# Patient Record
Sex: Female | Born: 1975 | Race: White | Hispanic: No | Marital: Married | Smoking: Former smoker
Health system: Southern US, Community
[De-identification: ages and names within clinical notes are randomized; demographics above are authoritative.]

## PROBLEM LIST (undated history)

## (undated) DIAGNOSIS — F32A Depression, unspecified: Secondary | ICD-10-CM

## (undated) DIAGNOSIS — G43909 Migraine, unspecified, not intractable, without status migrainosus: Secondary | ICD-10-CM

## (undated) DIAGNOSIS — F329 Major depressive disorder, single episode, unspecified: Secondary | ICD-10-CM

## (undated) HISTORY — PX: TONSILLECTOMY AND ADENOIDECTOMY: SUR1326

## (undated) HISTORY — PX: TONSILLECTOMY: SUR1361

## (undated) HISTORY — PX: APPENDECTOMY: SHX54

## (undated) HISTORY — DX: Migraine, unspecified, not intractable, without status migrainosus: G43.909

---

## 1999-02-28 ENCOUNTER — Inpatient Hospital Stay (HOSPITAL_COMMUNITY): Admission: AD | Admit: 1999-02-28 | Discharge: 1999-02-28 | Payer: Self-pay | Admitting: Obstetrics and Gynecology

## 1999-08-31 ENCOUNTER — Observation Stay (HOSPITAL_COMMUNITY): Admission: AD | Admit: 1999-08-31 | Discharge: 1999-09-01 | Payer: Self-pay | Admitting: Obstetrics and Gynecology

## 1999-09-04 ENCOUNTER — Inpatient Hospital Stay (HOSPITAL_COMMUNITY): Admission: AD | Admit: 1999-09-04 | Discharge: 1999-09-06 | Payer: Self-pay | Admitting: Obstetrics & Gynecology

## 2012-08-18 ENCOUNTER — Telehealth: Payer: Self-pay

## 2012-08-18 NOTE — Telephone Encounter (Deleted)
PT STATES SHE HAD BEEN SPEAKING WITH AMY REGARDING A PROBLEM SHE WAS HAVING AND WOULD LIKE TO SPEAK BACK WITH HER PLEASE CALL [phone]

## 2012-08-18 NOTE — Telephone Encounter (Signed)
THIS NOTE WAS PUT IN ERROR SORRY

## 2014-02-25 ENCOUNTER — Telehealth: Payer: Self-pay

## 2014-03-10 NOTE — Telephone Encounter (Signed)
No msg °

## 2015-10-13 DIAGNOSIS — E669 Obesity, unspecified: Secondary | ICD-10-CM | POA: Insufficient documentation

## 2015-10-13 DIAGNOSIS — F411 Generalized anxiety disorder: Secondary | ICD-10-CM | POA: Insufficient documentation

## 2017-07-18 ENCOUNTER — Emergency Department (HOSPITAL_BASED_OUTPATIENT_CLINIC_OR_DEPARTMENT_OTHER)
Admission: EM | Admit: 2017-07-18 | Discharge: 2017-07-18 | Disposition: A | Discharge status: Home / Self Care | Payer: Managed Care, Other (non HMO) | Attending: Emergency Medicine | Admitting: Emergency Medicine

## 2017-07-18 ENCOUNTER — Emergency Department (HOSPITAL_BASED_OUTPATIENT_CLINIC_OR_DEPARTMENT_OTHER): Payer: Managed Care, Other (non HMO)

## 2017-07-18 ENCOUNTER — Encounter (HOSPITAL_BASED_OUTPATIENT_CLINIC_OR_DEPARTMENT_OTHER): Payer: Self-pay | Admitting: *Deleted

## 2017-07-18 DIAGNOSIS — W010XXA Fall on same level from slipping, tripping and stumbling without subsequent striking against object, initial encounter: Secondary | ICD-10-CM | POA: Diagnosis not present

## 2017-07-18 DIAGNOSIS — Y92014 Private driveway to single-family (private) house as the place of occurrence of the external cause: Secondary | ICD-10-CM | POA: Insufficient documentation

## 2017-07-18 DIAGNOSIS — S93401A Sprain of unspecified ligament of right ankle, initial encounter: Secondary | ICD-10-CM | POA: Insufficient documentation

## 2017-07-18 DIAGNOSIS — Y998 Other external cause status: Secondary | ICD-10-CM | POA: Insufficient documentation

## 2017-07-18 DIAGNOSIS — Y9389 Activity, other specified: Secondary | ICD-10-CM | POA: Diagnosis not present

## 2017-07-18 DIAGNOSIS — Z87891 Personal history of nicotine dependence: Secondary | ICD-10-CM | POA: Diagnosis not present

## 2017-07-18 DIAGNOSIS — S99911A Unspecified injury of right ankle, initial encounter: Secondary | ICD-10-CM | POA: Diagnosis present

## 2017-07-18 DIAGNOSIS — W19XXXA Unspecified fall, initial encounter: Secondary | ICD-10-CM

## 2017-07-18 HISTORY — DX: Major depressive disorder, single episode, unspecified: F32.9

## 2017-07-18 HISTORY — DX: Depression, unspecified: F32.A

## 2017-07-18 MED ORDER — NAPROXEN 500 MG PO TABS: 500.0000 mg | ORAL_TABLET | Freq: Two times a day (BID) | ORAL | 0 refills | Status: DC

## 2017-07-18 NOTE — ED Triage Notes (Signed)
Slipped on wet driveway. Twisted her right ankle and foot.

## 2017-07-18 NOTE — Discharge Instructions (Signed)
Please read and follow all provided instructions.  Your diagnoses today include: Ankle sprain An ankle sprain is an injury to the ligaments that hold the ankle joint together causing them to get stretched or torn. It may take 4-6 weeks to heal fully. Your X-ray today showed no evidence of fracture (there is no evidence of broken bones).  For activity: Use crutches with non-weightbearing for the first few days. Exercises should be limited to pain free range of motion. You can start mobilization by tracing the alphabet with you foot in the air. Then, you may walk on your ankles as the pain allows (or as instructed). Start gradually with weight bearing on the affected ankle. Once you can walk pain free, then try jogging. When you can run forwards, then you can try moving side to side. If you cannot walk without crutches in one week, you need a recheck by your Family Doctor. It is important to keep all follow-up appointments as we discussed fractures may not appear until 1 week to 10 days after the acute injury. If you do not have a family doctor to follow-up with, you can see the list of phone numbers below. Please call today to make a followup appointment.  TREATMENT  Rest, ice, compression and elevation (RICE therapy) are the basic modes of treatment.   Rest is needed to allow your body to heal. Routine activities can be resumed when comfortable (as described above). Injury tendons and bones can take up to 6 weeks to heal. Tendons are cordlike structures that attach muscles and bones Ice: Apply ice to the sore area for 15 to 20 minutes, 3 to 4 times per day. Do this while you are awake for the first 2 days, or as directed. This can help reduce swelling and reduce pain.  Compression: this helps keep swelling down. It also gives support and helps with discomfort. If any lasting bandage has been applied, it should be removed and reapplied every 3-4 hours. It should not be applied tightly, but firmly enough to  keep swelling down. Watch fingers or toes for swelling, discoloration, coldness, numbness or excessive pain. If any of these problems occur, removed the bandage and reapply loosely. Contact your caregiver if these problems continue. If you were given an ankle stabilizer you may take it off at night and to take a shower or bath. Wiggle your toes in the splint several times per day if you are able.  Elevation helps reduce swelling and decrease your pain. With extremities such as the arms, hands, legs and feet, the injured area should be placed near or above the level of the heart if possible (place pillows underneath you leg/foot while you sleep to achieve this).  HOW TO MAKE AN ICE PACK  To make an ice pack, do one of the following:  Place crushed ice or a bag of frozen vegetables in a sealable plastic bag. Squeeze out the excess air. Place this bag inside another plastic bag. Slide the bag into a pillowcase or place a damp towel between your skin and the bag.  Mix 3 parts water with 1 part rubbing alcohol. Freeze the mixture in a sealable plastic bag. When you remove the mixture from the freezer, it will be slushy. Squeeze out the excess air. Place this bag inside another plastic bag. Slide the bag into a pillowcase or place a damp towel between your sk  Seek immediate medical attention if: You're toes are numb or tingling, appear gray or blue, or  you have severe pain. If this occurs please also elevate the leg and loosen the splint. Also if you have persistent pain and swelling, developed redness numbness or unexpected weakness, or your symptoms are getting worse rather than improving after several days. The symptoms may indicate that further evaluation or further x-rays are needed. Sometimes, x-rays may not show a small broken bone until one week or 10 days later. Make a follow-up appointment with your caregiver.  Additional Information:  Your vital signs today were: BP (!) 132/91    Pulse 80    Temp  98.7 F (37.1 C) (Oral)    Resp 18    Ht 5\' 3"  (1.6 m)    Wt 115.3 kg (254 lb 3.1 oz)    LMP 07/04/2017    SpO2 100%    BMI 45.03 kg/m  If your blood pressure (BP) was elevated above 135/85 this visit, please have this repeated by your doctor within one month. ---------------

## 2017-07-18 NOTE — ED Provider Notes (Signed)
[facility] EMERGENCY DEPARTMENT Provider Note   CSN: [id number] Arrival date & time: 07/18/17  1817     History   Chief Complaint Chief Complaint  Patient presents with  . Fall  . Foot Pain    HPI Sherri Hunt is a 41 y.o. female with no significant past medical history who presents the emergency department today for right ankle pain.  Earlier this evening the patient slipped on her wet driveway while she was trying to open the trunk of her car.  She states she twisted her right ankle in an inversion fashion, causing her to fall.  She denies any head trauma or loss of consciousness.  She has had no nausea or vomiting since the event.  She was unable to bear weight due to the pain of the right ankle immediately after the event.  Her pain is primarily in the lateral aspect of the ankle and on the top of the foot.  She has not taken anything for this.  She denies any numbness or tingling.  No open wound.  HPI  Past Medical History:  Diagnosis Date  . Depression     There are no active problems to display for this patient.   Past Surgical History:  Procedure Laterality Date  . APPENDECTOMY    . TONSILLECTOMY      OB History    No data available       Home Medications    Prior to Admission medications   Medication Sig Start Date End Date Taking? Authorizing Provider  BuPROPion HCl (WELLBUTRIN PO) Take by mouth.   Yes [provider]    Family History No family history on file.  Social History Social History  Substance Use Topics  . Smoking status: Former Games developer  . Smokeless tobacco: Never Used  . Alcohol use Yes     Allergies   Sulfa antibiotics   Review of Systems Review of Systems  Gastrointestinal: Negative for nausea and vomiting.  Musculoskeletal: Positive for arthralgias and joint swelling.  Skin: Negative for color change and wound.  Neurological: Negative for syncope and headaches.     Physical Exam Updated Vital  Signs BP (!) 132/91   Pulse 80   Temp 98.7 F (37.1 C) (Oral)   Resp 18   Ht 5\' 3"  (1.6 m)   Wt 115.3 kg (254 lb 3.1 oz)   LMP 07/04/2017   SpO2 100%   BMI 45.03 kg/m   Physical Exam  Constitutional: She appears well-developed and well-nourished.  HENT:  Head: Normocephalic and atraumatic.  Right Ear: External ear normal.  Left Ear: External ear normal.  Eyes: Conjunctivae are normal. Right eye exhibits no discharge. Left eye exhibits no discharge. No scleral icterus.  Cardiovascular:  Pulses:      Dorsalis pedis pulses are 2+ on the right side, and 2+ on the left side.       Posterior tibial pulses are 2+ on the right side, and 2+ on the left side.  Pulmonary/Chest: Effort normal. No respiratory distress.  Musculoskeletal:       Right knee: She exhibits normal range of motion and no swelling. No tenderness found.       Right ankle: She exhibits decreased range of motion (2/2 to pain) and swelling. She exhibits no ecchymosis, no deformity and normal pulse. Tenderness. Lateral malleolus tenderness found. No head of 5th metatarsal tenderness found. Achilles tendon normal. Achilles tendon exhibits no pain, no defect and normal Thompson's test results.  Right lower leg: Normal.       Right foot: There is tenderness ( Navicular ). There is normal range of motion.  Negative Lachman's test of the right knee.  Negative anterior drawer test of the right ankle. Neurovascularly intact distally. Compartments soft above and below affected joint.    Neurological: She is alert. No sensory deficit.  Speech clear. Follows commands. No facial droop. PERRLA. EOM grossly intact. CN III-XII grossly intact. Grossly moves all extremities 4 without ataxia. Able and appropriate strength for age to upper and lower extremities bilaterally including grip strength.   Skin: Skin is warm, dry and intact. No pallor.  Psychiatric: She has a normal mood and affect.  Nursing note and vitals  reviewed.    ED Treatments / Results  Labs (all labs ordered are listed, but only abnormal results are displayed) Labs Reviewed - No data to display  EKG  EKG Interpretation None       Radiology Dg Ankle Complete Right  Result Date: 07/18/2017 CLINICAL DATA:  Initial evaluation for acute trauma, fall. EXAM: RIGHT ANKLE - COMPLETE 3+ VIEW COMPARISON:  None. FINDINGS: No acute fracture or dislocation. Ankle mortise approximated. Talar dome intact. Mild diffuse soft tissue swelling about the ankle. Posterior plantar calcaneal enthesophytes noted. IMPRESSION: 1. No acute fracture or dislocation. 2. Mild diffuse soft tissue swelling about the ankle. Electronically Signed   By: Rise MuBenjamin  McClintock M.D.   On: 07/18/2017 19:42   Dg Foot Complete Right  Result Date: 07/18/2017 CLINICAL DATA:  Initial evaluation for acute trauma, fall. EXAM: RIGHT FOOT COMPLETE - 3+ VIEW COMPARISON:  None. FINDINGS: No acute fracture or dislocation. Joint spaces maintained without evidence for significant degenerative or erosive arthropathy. Posterior plantar calcaneal enthesophytes noted. No appreciable soft tissue injury. IMPRESSION: No acute osseous abnormality about the right foot. Electronically Signed   By: Rise MuBenjamin  McClintock M.D.   On: 07/18/2017 19:46    Procedures Procedures (including critical care time)  Medications Ordered in ED Medications - No data to display   Initial Impression / Assessment and Plan / ED Course  I have reviewed the triage vital signs and the nursing notes.  Pertinent labs & imaging results that were available during my care of the patient were reviewed by me and considered in my medical decision making (see chart for details).     Patient with fall. No head trauma or loss of consciousness.  She is having right ankle and foot pain. Patient X-Ray negative for obvious fracture or dislocation.  Patient denied pain medication in the ED.  Will give ASO brace and crutches.   Pt advised to follow up with orthopedics vs PCP if symptoms persist for possibility of missed fracture diagnosis. Conservative therapy recommended and discussed. Return precautions discussed. Patient will be dc home & is agreeable with above plan.  Final Clinical Impressions(s) / ED Diagnoses   Final diagnoses:  Sprain of right ankle, unspecified ligament, initial encounter  Fall, initial encounter    New Prescriptions New Prescriptions   NAPROXEN (NAPROSYN) 500 MG TABLET    Take 1 tablet (500 mg total) by mouth 2 (two) times daily.     Jacinto HalimMaczis, Michael M, PA-C 07/19/17 1501    Nira Connardama, Pedro Eduardo, MD 07/20/17 0030

## 2017-07-18 NOTE — ED Notes (Signed)
Pt discharged to home with family. NAD.  

## 2017-08-21 ENCOUNTER — Other Ambulatory Visit: Payer: Self-pay | Admitting: General Practice

## 2017-08-21 DIAGNOSIS — N6320 Unspecified lump in the left breast, unspecified quadrant: Secondary | ICD-10-CM

## 2019-08-19 DIAGNOSIS — G43111 Migraine with aura, intractable, with status migrainosus: Secondary | ICD-10-CM | POA: Insufficient documentation

## 2019-09-19 DIAGNOSIS — U071 COVID-19: Secondary | ICD-10-CM

## 2019-09-19 HISTORY — DX: COVID-19: U07.1

## 2019-11-24 ENCOUNTER — Other Ambulatory Visit: Payer: Self-pay

## 2019-11-24 ENCOUNTER — Encounter (HOSPITAL_BASED_OUTPATIENT_CLINIC_OR_DEPARTMENT_OTHER): Payer: Self-pay | Admitting: *Deleted

## 2019-11-24 ENCOUNTER — Emergency Department (HOSPITAL_COMMUNITY): Payer: Managed Care, Other (non HMO)

## 2019-11-24 ENCOUNTER — Emergency Department (HOSPITAL_BASED_OUTPATIENT_CLINIC_OR_DEPARTMENT_OTHER): Payer: Managed Care, Other (non HMO)

## 2019-11-24 ENCOUNTER — Emergency Department (HOSPITAL_BASED_OUTPATIENT_CLINIC_OR_DEPARTMENT_OTHER)
Admission: EM | Admit: 2019-11-24 | Discharge: 2019-11-25 | Disposition: A | Discharge status: Home / Self Care | Payer: Managed Care, Other (non HMO) | Attending: Emergency Medicine | Admitting: Emergency Medicine

## 2019-11-24 DIAGNOSIS — Z79899 Other long term (current) drug therapy: Secondary | ICD-10-CM | POA: Diagnosis not present

## 2019-11-24 DIAGNOSIS — R519 Headache, unspecified: Secondary | ICD-10-CM | POA: Insufficient documentation

## 2019-11-24 DIAGNOSIS — R471 Dysarthria and anarthria: Secondary | ICD-10-CM | POA: Diagnosis not present

## 2019-11-24 DIAGNOSIS — Z20822 Contact with and (suspected) exposure to covid-19: Secondary | ICD-10-CM | POA: Insufficient documentation

## 2019-11-24 DIAGNOSIS — Z87891 Personal history of nicotine dependence: Secondary | ICD-10-CM | POA: Insufficient documentation

## 2019-11-24 LAB — COMPREHENSIVE METABOLIC PANEL
ALT: 24 U/L (ref 0–44)
AST: 30 U/L (ref 15–41)
Albumin: 3.6 g/dL (ref 3.5–5.0)
Alkaline Phosphatase: 62 U/L (ref 38–126)
Anion gap: 8 (ref 5–15)
BUN: 11 mg/dL (ref 6–20)
CO2: 26 mmol/L (ref 22–32)
Calcium: 9.1 mg/dL (ref 8.9–10.3)
Chloride: 101 mmol/L (ref 98–111)
Creatinine, Ser: 0.89 mg/dL (ref 0.44–1.00)
GFR calc Af Amer: >60 mL/min (ref >60)
GFR calc non Af Amer: >60 mL/min (ref >60)
Glucose, Bld: 96 mg/dL (ref 70–99)
Potassium: 3.8 mmol/L (ref 3.5–5.1)
Sodium: 135 mmol/L (ref 135–145)
Total Bilirubin: 0.7 mg/dL (ref 0.3–1.2)
Total Protein: 7.3 g/dL (ref 6.5–8.1)

## 2019-11-24 LAB — URINALYSIS, MICROSCOPIC (REFLEX): WBC, UA: NONE SEEN WBC/hpf (ref 0–5)

## 2019-11-24 LAB — URINALYSIS, ROUTINE W REFLEX MICROSCOPIC
Bilirubin Urine: NEGATIVE
Glucose, UA: NEGATIVE mg/dL
Ketones, ur: NEGATIVE mg/dL
Leukocytes,Ua: NEGATIVE
Nitrite: NEGATIVE
Protein, ur: NEGATIVE mg/dL
Specific Gravity, Urine: 1.01 (ref 1.005–1.030)
pH: 7.5 (ref 5.0–8.0)

## 2019-11-24 LAB — CBC
HCT: 40.1 % (ref 36.0–46.0)
Hemoglobin: 13 g/dL (ref 12.0–15.0)
MCH: 26.7 pg (ref 26.0–34.0)
MCHC: 32.4 g/dL (ref 30.0–36.0)
MCV: 82.5 fL (ref 80.0–100.0)
Platelets: 422 10*3/uL — ABNORMAL HIGH (ref 150–400)
RBC: 4.86 MIL/uL (ref 3.87–5.11)
RDW: 14.4 % (ref 11.5–15.5)
WBC: 10 10*3/uL (ref 4.0–10.5)
nRBC: 0 % (ref 0.0–0.2)

## 2019-11-24 LAB — DIFFERENTIAL
Abs Immature Granulocytes: 0.04 10*3/uL (ref 0.00–0.07)
Basophils Absolute: 0.1 10*3/uL (ref 0.0–0.1)
Basophils Relative: 1 %
Eosinophils Absolute: 0.2 10*3/uL (ref 0.0–0.5)
Eosinophils Relative: 2 %
Immature Granulocytes: 0 %
Lymphocytes Relative: 23 %
Lymphs Abs: 2.3 10*3/uL (ref 0.7–4.0)
Monocytes Absolute: 0.6 10*3/uL (ref 0.1–1.0)
Monocytes Relative: 6 %
Neutro Abs: 6.8 10*3/uL (ref 1.7–7.7)
Neutrophils Relative %: 68 %

## 2019-11-24 LAB — SARS CORONAVIRUS 2 AG (30 MIN TAT): SARS Coronavirus 2 Ag: NEGATIVE

## 2019-11-24 LAB — PROTIME-INR
INR: 1.1 (ref 0.8–1.2)
Prothrombin Time: 13.6 seconds (ref 11.4–15.2)

## 2019-11-24 LAB — RAPID URINE DRUG SCREEN, HOSP PERFORMED
Amphetamines: NOT DETECTED
Barbiturates: NOT DETECTED
Benzodiazepines: NOT DETECTED
Cocaine: NOT DETECTED
Opiates: NOT DETECTED
Tetrahydrocannabinol: NOT DETECTED

## 2019-11-24 LAB — ETHANOL: Alcohol, Ethyl (B): <10 mg/dL (ref <10)

## 2019-11-24 LAB — APTT: aPTT: 31 seconds (ref 24–36)

## 2019-11-24 LAB — CBG MONITORING, ED: Glucose-Capillary: 91 mg/dL (ref 70–99)

## 2019-11-24 LAB — PREGNANCY, URINE: Preg Test, Ur: NEGATIVE

## 2019-11-24 MED ORDER — DEXAMETHASONE SODIUM PHOSPHATE 10 MG/ML IJ SOLN
10.0000 mg | Freq: Once | INTRAMUSCULAR | Status: AC
  Administered 2019-11-24: 10 mg via INTRAVENOUS
  Filled 2019-11-24: qty 1

## 2019-11-24 MED ORDER — DIPHENHYDRAMINE HCL 50 MG/ML IJ SOLN
25.0000 mg | Freq: Once | INTRAMUSCULAR | Status: AC
  Administered 2019-11-24: 25 mg via INTRAVENOUS
  Filled 2019-11-24: qty 1

## 2019-11-24 MED ORDER — METOCLOPRAMIDE HCL 5 MG/ML IJ SOLN
10.0000 mg | Freq: Once | INTRAMUSCULAR | Status: AC
  Administered 2019-11-24: 10 mg via INTRAVENOUS
  Filled 2019-11-24: qty 2

## 2019-11-24 MED ORDER — IOHEXOL 350 MG/ML SOLN
100.0000 mL | Freq: Once | INTRAVENOUS | Status: AC | PRN
  Administered 2019-11-24: 100 mL via INTRAVENOUS

## 2019-11-24 MED ORDER — KETOROLAC TROMETHAMINE 30 MG/ML IJ SOLN
30.0000 mg | Freq: Once | INTRAMUSCULAR | Status: AC
  Administered 2019-11-24: 30 mg via INTRAVENOUS
  Filled 2019-11-24: qty 1

## 2019-11-24 MED ORDER — SODIUM CHLORIDE 0.9 % IV BOLUS
1000.0000 mL | Freq: Once | INTRAVENOUS | Status: AC
  Administered 2019-11-24: 1000 mL via INTRAVENOUS

## 2019-11-24 MED ORDER — IOHEXOL 350 MG/ML SOLN
50.0000 mL | Freq: Once | INTRAVENOUS | Status: AC | PRN
  Administered 2019-11-24: 50 mL via INTRAVENOUS

## 2019-11-24 NOTE — ED Notes (Signed)
To CT with RN and monitor

## 2019-11-24 NOTE — ED Provider Notes (Signed)
[facility] EMERGENCY DEPARTMENT Provider Note   CSN: [id number] Arrival date & time: 11/24/19  3790  An emergency department physician performed an initial assessment on this suspected stroke patient at 93.  History Chief Complaint  Patient presents with  . Aphasia    Sherri Hunt is a 45 y.o. female.  Patient is a 44 year old female with history of depression and migraines.  She presents today for evaluation of headache and difficulty speaking.  For the past 2 days, she has had what she describes as a migraine.  This afternoon her husband went to the pharmacy.  When he returned, she was unable to speak.  Patient describes being unable to form words, but she is able to express herself using her cell phone and typing.  She denies any weakness or numbness.  She denies any visual disturbances.  The symptoms began at approximately 230 this afternoon.  The history is provided by the patient.       Past Medical History:  Diagnosis Date  . Depression     There are no problems to display for this patient.   Past Surgical History:  Procedure Laterality Date  . APPENDECTOMY    . TONSILLECTOMY       OB History   No obstetric history on file.     History reviewed. No pertinent family history.  Social History   Tobacco Use  . Smoking status: Former Games developer  . Smokeless tobacco: Never Used  Substance Use Topics  . Alcohol use: Yes  . Drug use: No    Home Medications Prior to Admission medications   Medication Sig Start Date End Date Taking? Authorizing Provider  naproxen (NAPROSYN) 500 MG tablet Take 1 tablet (500 mg total) by mouth 2 (two) times daily. 07/18/17   Maczis, Elmer Sow, PA-C    Allergies    Sulfa antibiotics  Review of Systems   Review of Systems  All other systems reviewed and are negative.   Physical Exam Updated Vital Signs BP (!) 146/98   Pulse 87   Resp 19   Ht 5\' 3"  (1.6 m)   Wt 133.6 kg   SpO2 99%   BMI 52.17 kg/m    Physical Exam Vitals and nursing note reviewed.  Constitutional:      General: She is not in acute distress.    Appearance: She is well-developed. She is not diaphoretic.  HENT:     Head: Normocephalic and atraumatic.  Eyes:     Extraocular Movements: Extraocular movements intact.     Pupils: Pupils are equal, round, and reactive to light.  Cardiovascular:     Rate and Rhythm: Normal rate and regular rhythm.     Heart sounds: No murmur. No friction rub. No gallop.   Pulmonary:     Effort: Pulmonary effort is normal. No respiratory distress.     Breath sounds: Normal breath sounds. No wheezing.  Abdominal:     General: Bowel sounds are normal. There is no distension.     Palpations: Abdomen is soft.     Tenderness: There is no abdominal tenderness.  Musculoskeletal:        General: Normal range of motion.     Cervical back: Normal range of motion and neck supple.  Skin:    General: Skin is warm and dry.  Neurological:     General: No focal deficit present.     Mental Status: She is alert and oriented to person, place, and time.     Cranial  Nerves: No cranial nerve deficit.     Comments: Patient with dysphasia.  She attempts to speak, but her words are slurred and incomprehensible.  She is able to express herself using her cell phone and types without difficulty.  Strength is 5 out of 5 in all extremities and sensation is intact throughout.  Finger-to-nose is normal.      ED Results / Procedures / Treatments   Labs (all labs ordered are listed, but only abnormal results are displayed) Labs Reviewed  CBC - Abnormal; Notable for the following components:      Result Value   Platelets 422 (*)    All other components within normal limits  DIFFERENTIAL  ETHANOL  PROTIME-INR  APTT  COMPREHENSIVE METABOLIC PANEL  RAPID URINE DRUG SCREEN, HOSP PERFORMED  URINALYSIS, ROUTINE W REFLEX MICROSCOPIC  PREGNANCY, URINE  CBG MONITORING, ED    EKG EKG  Interpretation  Date/Time:  Wednesday November 24 2019 18:49:34 EST Ventricular Rate:  70 PR Interval:    QRS Duration: 120 QT Interval:  389 QTC Calculation: 420 R Axis:   33 Text Interpretation: Sinus rhythm Atrial premature complexes Borderline short PR interval IVCD, consider atypical RBBB Confirmed by Veryl Speak [phone]) on 11/24/2019 7:30:38 PM   Radiology CT HEAD CODE STROKE WO CONTRAST  Result Date: 11/24/2019 CLINICAL DATA:  Code stroke.  Speech disturbance.  Headache. EXAM: CT HEAD WITHOUT CONTRAST TECHNIQUE: Contiguous axial images were obtained from the base of the skull through the vertex without intravenous contrast. COMPARISON:  None. FINDINGS: Brain: Normal. No evidence of old or acute infarction, mass lesion, hemorrhage, hydrocephalus or extra-axial collection. Vascular: Normal Skull: Normal Sinuses/Orbits: Small amount of fluid dependent in the sphenoid sinus, not likely significant. Other sinuses clear. Orbits negative. Other: None ASPECTS ([place] Stroke Program Early CT Score) - Ganglionic level infarction (caudate, lentiform nuclei, internal capsule, insula, M1-M3 cortex): 7 - Supraganglionic infarction (M4-M6 cortex): 3 Total score (0-10 with 10 being normal): 10 IMPRESSION: 1. Normal head CT 2. ASPECTS is 10. 3. Results were called to Dr. Stark Jock by myself at [place] hours. Electronically Signed   By: Nelson Chimes M.D.   On: 11/24/2019 19:16    Procedures Procedures (including critical care time)  Medications Ordered in ED Medications - No data to display  ED Course  I have reviewed the triage vital signs and the nursing notes.  Pertinent labs & imaging results that were available during my care of the patient were reviewed by me and considered in my medical decision making (see chart for details).    MDM Rules/Calculators/A&P  Patient is a 44 year old female presenting here with complaints of an inability to speak.  This started approximately 2:00 this afternoon.  It  was preceded by a headache that started 2 days ago.  Patient is otherwise neurologically intact and is able to express her self and respond by typing messages on her cell phone, but her speech is slurred and incomprehensible.  As the patient presented here within several hours of symptom onset, a code stroke was initiated.  This showed a negative head CT and normal CT angio of the head and neck.  Patient was seen by teleneurology who is recommending an MRI.  Patient will be transferred to [facility] for this study.  I have spoken with Dr. Tyrone Nine in the ER who agrees to accept in transfer.  Patient was given a migraine cocktail here with no change in her status.  CRITICAL CARE Performed by: Veryl Speak Total critical care  time: 45 minutes Critical care time was exclusive of separately billable procedures and treating other patients. Critical care was necessary to treat or prevent imminent or life-threatening deterioration. Critical care was time spent personally by me on the following activities: development of treatment plan with patient and/or surrogate as well as nursing, discussions with consultants, evaluation of patient's response to treatment, examination of patient, obtaining history from patient or surrogate, ordering and performing treatments and interventions, ordering and review of laboratory studies, ordering and review of radiographic studies, pulse oximetry and re-evaluation of patient's condition.   Final Clinical Impression(s) / ED Diagnoses Final diagnoses:  None    Rx / DC Orders ED Discharge Orders    None       Geoffery Lyons, MD 11/24/19 2130

## 2019-11-24 NOTE — Consult Note (Addendum)
[facility] Tele[facility] Consult Services   Date of Service:   11/24/2019 19:08:47  Impression:     .  R47.01 - Aphasia     .  S96.2 - Complicated migraine  Comments/Sign-Out: Exam is suggestive of possible aphasia versus complicated migraine. Also conversion disorder does come in differential diagnosis. Her plain CT is negative. We will do a CTA of the head and neck to make sure she does not have a large vessel occlusion. If that is negative, would recommend bringing her in for an MRI of the head and starting her on aspirin at that point. I would also recommend symptomatic treatment of her headaches. Depending on the progress will plan further care. She will need neurology follow-up also  Metrics: Last Known Well: 11/24/2019 14:30:00 [facility] Notification Time: 11/24/2019 19:08:47 Arrival Time: 11/24/2019 18:33:00 Stamp Time: 11/24/2019 19:08:47 Time First Login Attempt: 11/24/2019 19:13:06 Symptoms: Speech difficulty NIHSS Start Assessment Time: 11/24/2019 19:16:03 Patient is not a candidate for Alteplase/Activase. Alteplase Medical Decision: 11/24/2019 19:21:42 Patient was not deemed candidate for Alteplase/Activase thrombolytics because of Last Well Known Above 4.5 Hours.  CT head showed no acute hemorrhage or acute core infarct.  Lower Likelihood of Large Vessel Occlusion but Following Stat Studies are Recommended  CTA Head and Neck.   ED Physician notified of diagnostic impression and management plan on 11/24/2019 19:25:35  Our recommendations are outlined below.  Recommendations:     .  Activate Stroke Protocol Admission/Order Set     .  Stroke/Telemetry Floor     .  Neuro Checks     .  Bedside Swallow Eval     .  DVT Prophylaxis     .  IV Fluids, Normal Saline     .  Head of Bed 30 Degrees     .  Euglycemia and Avoid Hyperthermia (PRN Acetaminophen)     .  Initiate Aspirin 325 MG Daily  Routine Consultation with [facility] for  Follow up Care  Sign Out:     .  Discussed with Emergency Department Provider    ------------------------------------------------------------------------------  History of Present Illness: Patient is a 44 year old Female.  Patient was brought by private transportation with symptoms of Speech difficulty  45 year old female with PMH of Migraine and depression with 3 day history of headaches, came to Ed with speech difficulty. Patient is having difficulty saying what she wants to say. She denies any focal weakness, No visual symptoms were reported. She has no difficulty understanding but cannot say what she wants to say. She has no problem writing.    Past Medical History:     . There is NO history of Hypertension     . There is NO history of Diabetes Mellitus     . There is NO history of Hyperlipidemia     . There is NO history of Atrial Fibrillation     . There is NO history of Coronary Artery Disease     . There is NO history of Stroke  Anticoagulant use:  No  Antiplatelet use: No     Examination: BP(146/98), Pulse(76), Blood Glucose(91) 1A: Level of Consciousness - Alert; keenly responsive + 0 1B: Ask Month and Age - Both Questions Right + 0 1C: Blink Eyes & Squeeze Hands - Performs Both Tasks + 0 2: Test Horizontal Extraocular Movements - Normal + 0 3: Test Visual Fields - No Visual Loss + 0 4: Test Facial Palsy (Use Grimace if Obtunded) - Normal symmetry +  0 5A: Test Left Arm Motor Drift - No Drift for 10 Seconds + 0 5B: Test Right Arm Motor Drift - No Drift for 10 Seconds + 0 6A: Test Left Leg Motor Drift - No Drift for 5 Seconds + 0 6B: Test Right Leg Motor Drift - No Drift for 5 Seconds + 0 7: Test Limb Ataxia (FNF/Heel-Shin) - No Ataxia + 0 8: Test Sensation - Normal; No sensory loss + 0 9: Test Language/Aphasia - Severe Aphasia: Fragmentary Expression, Inference Needed, Cannot Identify Materials + 2 10: Test Dysarthria - Normal + 0 11: Test  Extinction/Inattention - No abnormality + 0  NIHSS Score: 2  Pre-Morbid Modified Ranking Scale: 0 Points = No symptoms at all   Patient/Family was informed the Neurology Consult would occur via TeleHealth consult by way of interactive audio and video telecommunications and consented to receiving care in this manner.   Due to the immediate potential for life-threatening deterioration due to underlying acute neurologic illness, I spent 30 minutes providing critical care. This time includes time for face to face visit via telemedicine, review of medical records, imaging studies and discussion of findings with providers, the patient and/or family.   Dr Hedy Camara   [facility] [phone]  Case [id number]  Addedndum: CTA of the head is negative for LVO. Need admission for MRI and management of complicated headache vs CVA

## 2019-11-24 NOTE — ED Notes (Signed)
To CTA via stretcher

## 2019-11-24 NOTE — ED Notes (Signed)
ED Provider at bedside. 

## 2019-11-24 NOTE — ED Provider Notes (Signed)
Care assumed from Dr. Judd Lien as patient was a transfer from [facility].   In brief, this patient is a 44 y.o. F with PMH/o depression, migraines who presents for evaluation of headache, difficulty speaking.  She reports that over the last 2 days, she has had a migraine.  Today at about 2:30 pm this afternoon, she started having difficulty speaking.  She feels like she knows the words that she is wanting to say but cannot get them out.  No numbness/weakness of her arms or legs.  Please see note from previous provider for full history/physical exam.    Physical Exam  BP (!) 145/96 (BP Location: Left Arm)   Pulse 72   Temp 97.9 F (36.6 C) (Oral)   Resp 18   Ht 5\' 3"  (1.6 m)   Wt 133.6 kg   SpO2 94%   BMI 52.17 kg/m   Physical Exam  5/5 strength BUE and BLE CN II-XII intact without any difficulty.  No facial droop. Slurred speech noted.  She has long drawn out words and tries to say certain objects but is unable to get them out.  ED Course/Procedures     Procedures  MDM    PLAN: Patient transferred over for MRI after evaluation by teleneuro.  MDM:  CTA head and neck negative for any acute abnormalities.  MRI brain negative for acute stroke.  Curbside consult done with Dr. (neuro).  Patient be discharged home with outpatient neuro follow-up.  Discussed results with patient.  Patient does feel like it is getting better.  She still has some mild slurred speech but it does sound somewhat improved. At this time, patient exhibits no emergent life-threatening condition that require further evaluation in ED or admission. Patient had ample opportunity for questions and discussion. All patient's questions were answered with full understanding. Strict return precautions discussed. Patient expresses understanding and agreement to plan.   1. Dysarthria     Portions of this note were generated with Dragon dictation software. Dictation errors may occur despite best attempts at  proofreading.    Danella Penton, PA-C 11/25/19 01/25/20    [id number], MD 11/25/19 [phone]

## 2019-11-24 NOTE — ED Triage Notes (Signed)
Pt communicating via writing. States that she cannot speak. Pt is alert, answering and obeying commands. HA x 2-3 days, hx of migraines. No droop, drift noted.

## 2019-11-24 NOTE — ED Triage Notes (Signed)
Pt transferred from med center [facility] to follow up for MRI for possible stroke symptoms

## 2019-11-24 NOTE — ED Notes (Signed)
Glucose 91

## 2019-11-25 ENCOUNTER — Emergency Department (HOSPITAL_COMMUNITY): Payer: Managed Care, Other (non HMO)

## 2019-11-25 DIAGNOSIS — D508 Other iron deficiency anemias: Secondary | ICD-10-CM | POA: Insufficient documentation

## 2019-11-25 DIAGNOSIS — K9589 Other complications of other bariatric procedure: Secondary | ICD-10-CM | POA: Insufficient documentation

## 2019-11-25 DIAGNOSIS — F330 Major depressive disorder, recurrent, mild: Secondary | ICD-10-CM | POA: Insufficient documentation

## 2019-11-25 DIAGNOSIS — E538 Deficiency of other specified B group vitamins: Secondary | ICD-10-CM | POA: Insufficient documentation

## 2019-11-25 MED ORDER — LORAZEPAM 2 MG/ML IJ SOLN
1.0000 mg | Freq: Once | INTRAMUSCULAR | Status: AC
  Administered 2019-11-25: 1 mg via INTRAVENOUS
  Filled 2019-11-25: qty 1

## 2019-11-25 NOTE — Discharge Instructions (Signed)
Your work-up today did not show a reason as to when it was causing your symptoms.  Your MRI did not show any signs of stroke.  You will need to follow-up with outpatient neurology as directed.  Return to the emergency department for any worsening symptoms, numbness/weakness of your arms or legs, difficulty breathing, chest pain or any other worsening concerning symptoms.

## 2019-12-29 ENCOUNTER — Other Ambulatory Visit: Payer: Self-pay

## 2019-12-29 ENCOUNTER — Ambulatory Visit: Payer: Managed Care, Other (non HMO) | Admitting: Neurology

## 2019-12-29 ENCOUNTER — Encounter: Payer: Self-pay | Admitting: Neurology

## 2019-12-29 VITALS — BP 149/87 | HR 77 | Temp 97.6°F | Ht 63.0 in | Wt 287.5 lb

## 2019-12-29 DIAGNOSIS — R0683 Snoring: Secondary | ICD-10-CM | POA: Diagnosis not present

## 2019-12-29 DIAGNOSIS — R519 Headache, unspecified: Secondary | ICD-10-CM | POA: Diagnosis not present

## 2019-12-29 DIAGNOSIS — G43009 Migraine without aura, not intractable, without status migrainosus: Secondary | ICD-10-CM | POA: Diagnosis not present

## 2019-12-29 DIAGNOSIS — Z6841 Body Mass Index (BMI) 40.0 and over, adult: Secondary | ICD-10-CM

## 2019-12-29 MED ORDER — UBRELVY 50 MG PO TABS: 50.0000 mg | ORAL_TABLET | ORAL | 3 refills | Status: DC | PRN

## 2019-12-29 NOTE — Patient Instructions (Signed)
Your neurological exam is normal thankfully.  You have had improvement in your migraine frequency since starting the Qsymia, most likely the topiramate and the Qsymia has been helpful for migraine prevention.  I would recommend no change in medication for migraine prevention.  For as needed use and management of acute migraines I recommend you try Ubrelvy, 50 mg strength: Take 1 pill at onset of migraine headache, may repeat in 2 hours, no more than 4 pills in 24 hours, i.e. not to exceed 200 mg per 24 hours. May cause sedation and nausea.   Please do not take both the Imitrex and the Ubrelvy together, only one or the other.   You can still use the Imitrex as needed.  Your recent head CT and a brain MRI were unremarkable.  Nevertheless, since you have intermittent blurry vision and have not seen your eye doctor and 6 to 8 months, please make an appointment with your eye doctor.  As discussed, I would like to proceed with a sleep study to rule out underlying obstructive sleep apnea as a contributor to your headaches as you have woken up with a headache as well and you have a history of snoring.

## 2019-12-29 NOTE — Progress Notes (Signed)
Subjective:    Patient ID: Sherri Hunt is a 44 y.o. female.  HPI     Huston Foley, MD, PhD [facility] [address] [address] [place], [place] [address]  I saw patient, Sherri Hunt, as a referral from the ER for her recent episode of headache and aphasia with some dysarthria.  The patient is accompanied by her husband today.  She presented to the emergency room on 11/24/2019 with new onset inability to speak, she was able to understand and communicate in writing.  She had some slurring of speech and at times incomprehensible speech.  She had treatment for migraine in the emergency room as she has a history of migraines.  She is a 44 year old right-handed woman with an underlying history of migraines, depression and morbid obesity with a BMI of over 50 and reports having had migraines since elementary school days.  She has associated light sensitivity and nausea.  She has tried Topamax for prevention in the past which was not helpful but since she started Qsymia she has found that her migraines are better.  She used to have a migraine twice a week on average and now she has 1-3 or maybe 4 times a month.  She also believes that stress has been a trigger for her migraines.  She has a stressful job.  She tries to hydrate better with water.  In the past, when she reduced her caffeine she noticed a flareup in her migraines but when she most recently stopped drinking sodas, she did not notice a significant flareup in her migraines.  She feels that her speech is better.  Sometimes she has word finding difficulty.  She also has some daytime tiredness.  She reports that she noticed an increase in her daytime tiredness since she was diagnosed with Covid in January 2021.  She lives with her husband, they have 5 children.  She quit smoking in 2016 and currently limits her caffeine to 1 serving per day.  She is trying to lose weight.  She does snore.  She has woken up with a headache.   She does not have night to night nocturia.  She is familiar with the sleep apnea diagnosis since her husband has a CPAP machine but she herself has never had a sleep study.  She would be willing to pursue a sleep study if treatment of sleep apnea may help her migraines.  In the past, for acute management she has tried Maxalt, she is not sure if it helps, she tried Zomig which did not help.  She takes Imitrex as needed, has been on it for some years per primary care but it does make her feel jittery at times and also nauseated.  She typically has an eye examination once a year, last exam was 6 or 8 months ago.  She wears contacts.  She has had intermittent blurry vision.  Denies double vision.  She denies any sudden onset of one-sided weakness or numbness or tingling or droopy face.  Her husband felt that her left side of the mouth was droopy when she was in the ER but had resolved while in the ER.  In the ER, she had imaging tests, as well as teleneurology consult.  She had a head CT without contrast on 11/24/2019 and I reviewed the results: IMPRESSION: 1. Normal head CT 2. ASPECTS is 10. She had a CT angiogram of the head and neck with and without contrast on 11/24/2019 and I reviewed the  results: IMPRESSION: 1. Normal CTA of the head and neck. No large vessel occlusion, hemodynamically significant stenosis, or other acute vascular abnormality. 2. Predominant fetal type origin of the PCAs with associated diminutive vertebrobasilar system. She had a brain MRI without contrast on 11/25/2019 and I reviewed the results: IMPRESSION: Normal brain MRI for age. No acute intracranial infarct or other abnormality. Laboratory results included negative urinalysis, negative UDS and neg ethanol level. Symptomatic treatment in the emergency room included Decadron, Benadryl, Toradol, Reglan and Ativan.  Her Past Medical History Is Significant For: Past Medical History:  Diagnosis Date  . COVID-19 09/19/2019   . Depression   . Migraine     Her Past Surgical History Is Significant For: Past Surgical History:  Procedure Laterality Date  . APPENDECTOMY    . TONSILLECTOMY AND ADENOIDECTOMY      Her Family History Is Significant For: Family History  Problem Relation Age of Onset  . Breast cancer Mother   . Factor V Leiden deficiency Mother   . Thyroid disease Mother   . Healthy Father     Her Social History Is Significant For: Social History   Socioeconomic History  . Marital status: Married    Spouse name: Not on file  . Number of children: 5  . Years of education: 23  . Highest education level: High school graduate  Occupational History  . Occupation: accounting  Tobacco Use  . Smoking status: Former Smoker    Quit date: 2016    Years since quitting: 5.2  . Smokeless tobacco: Never Used  Substance and Sexual Activity  . Alcohol use: Not Currently  . Drug use: Never  . Sexual activity: Not on file  Other Topics Concern  . Not on file  Social History Narrative   Lives at home with family.   Right-handed.   1 cup of tea or coffee per day.   Social Determinants of Health   Financial Resource Strain:   . Difficulty of Paying Living Expenses:   Food Insecurity:   . Worried About Charity fundraiser in the Last Year:   . Arboriculturist in the Last Year:   Transportation Needs:   . Film/video editor (Medical):   Marland Kitchen Lack of Transportation (Non-Medical):   Physical Activity:   . Days of Exercise per Week:   . Minutes of Exercise per Session:   Stress:   . Feeling of Stress :   Social Connections:   . Frequency of Communication with Friends and Family:   . Frequency of Social Gatherings with Friends and Family:   . Attends Religious Services:   . Active Member of Clubs or Organizations:   . Attends Archivist Meetings:   Marland Kitchen Marital Status:     Her Allergies Are:  Allergies  Allergen Reactions  . Sulfa Antibiotics     itching  :   Her Current  Medications Are:  Outpatient Encounter Medications as of 12/29/2019  Medication Sig  . citalopram (CELEXA) 40 MG tablet Take 40 mg by mouth daily.  . cyanocobalamin (,VITAMIN B-12,) 1000 MCG/ML injection Inject 1,000 mcg into the muscle once a week.  . furosemide (LASIX) 20 MG tablet Take 20 mg by mouth daily.  . Phentermine-Topiramate (QSYMIA) 7.5-46 MG CP24 Take 1 capsule by mouth daily.  . SUMAtriptan (IMITREX) 50 MG tablet Take 50 mg by mouth as needed.  . Vitamin D, Ergocalciferol, (DRISDOL) 1.25 MG (50000 UNIT) CAPS capsule Take 50,000 Units by mouth once a  week.  . [DISCONTINUED] naproxen (NAPROSYN) 500 MG tablet Take 1 tablet (500 mg total) by mouth 2 (two) times daily.   No facility-administered encounter medications on file as of 12/29/2019.  : Review of Systems:  Out of a complete 14 point review of systems, all are reviewed and negative with the exception of these symptoms as listed below:  Review of Systems  Neurological:       Room 2 w/ husband, Dorene Sorrow. She has hx of migraines. Reports having issues w/ decreased vision, nausea, vomiting and light/noise sensitivity during her severe headaches. She is here for follow up of ED visit on 11/25/19. She had a migraine with slurred speech. Concerned about stroke. Normal MRI brain while there. Pain improved w/ IV cocktail.//mck,rn    Objective:  Neurological Exam  Physical Exam Physical Examination:   Vitals:   12/29/19 1000  BP: (!) 149/87  Pulse: 77  Temp: 97.6 F (36.4 C)    General Examination: The patient is a very pleasant 44 y.o. female in no acute distress. She appears well-developed and well-nourished and well groomed.   HEENT: Normocephalic, atraumatic, pupils are equal, round and reactive to light and accommodation. Funduscopic exam is normal with sharp disc margins noted. Contacts in place, extraocular tracking is good without limitation to gaze excursion or nystagmus noted. Normal smooth pursuit is noted. Hearing is  grossly intact. Tympanic membranes are clear bilaterally. Face is symmetric with normal facial animation and normal facial sensation. Speech is clear with no dysarthria noted. There is no hypophonia. There is no lip, neck/head, jaw or voice tremor. Neck is supple with full range of passive and active motion. There are no carotid bruits on auscultation. Oropharynx exam reveals: mild mouth dryness, adequate dental hygiene and mild airway crowding, due to smaller airway. Mallampati is class I. Tongue protrudes centrally and palate elevates symmetrically. Tonsils are absent. Neck size is 17.75 inches.   Chest: Clear to auscultation without wheezing, rhonchi or crackles noted.  Heart: S1+S2+0, regular and normal without murmurs, rubs or gallops noted.   Abdomen: Soft, non-tender and non-distended with normal bowel sounds appreciated on auscultation.  Extremities: There is no pitting edema in the distal lower extremities bilaterally. Pedal pulses are intact.  Skin: Warm and dry without trophic changes noted.  Musculoskeletal: exam reveals no obvious joint deformities, tenderness or joint swelling or erythema.   Neurologically:  Mental status: The patient is awake, alert and oriented in all 4 spheres. Her immediate and remote memory, attention, language skills and fund of knowledge are appropriate. There is no evidence of aphasia, agnosia, apraxia or anomia. Speech is clear with normal prosody and enunciation. Thought process is linear. Mood is normal and affect is normal.  Cranial nerves II - XII are as described above under HEENT exam. In addition: shoulder shrug is normal with equal shoulder height noted. Motor exam: Normal bulk, strength and tone is noted. There is no drift, tremor or rebound. Romberg is negative. Reflexes are 1+ throughout. Babinski: Toes are flexor bilaterally. Fine motor skills and coordination: intact with normal Hunt taps, normal hand movements, normal rapid alternating patting,  normal foot taps and normal foot agility.  Cerebellar testing: No dysmetria or intention tremor on Hunt to nose testing. Heel to shin is unremarkable bilaterally. There is no truncal or gait ataxia.  Sensory exam: intact to light touch, vibration, temperature sense in the upper and lower extremities.  Gait, station and balance: She stands easily. No veering to one side is noted. No leaning  to one side is noted. Posture is age-appropriate and stance is narrow based. Gait shows normal stride length and normal pace. No problems turning are noted. Tandem walk is unremarkable.   Assessment and Plan:   In summary, Sherri Hunt is a very pleasant 44 y.o.-year old female with an underlying history of migraines, depression and morbid obesity with a BMI of over 50, who presents as a referral from the emergency room for her migraine headache as well as an episode of dysphagia and dysarthria which improved with symptomatic treatment of her migraine with a migraine cocktail, she had extensive imaging testing which did not show any acute stroke.  Neurological exam is normal thankfully.  She has a longstanding history of migraines, typically without aura.  She has noticed some blurry vision lately and is encouraged to seek follow-up with her eye doctor for a sooner than scheduled appointment.  She has tried several triptans including Imitrex most recently and Relpax as well as Maxalt.  She feels that the Imitrex causes her to feel jittery at times and nauseated.  She may be a good candidate for one of the newer oral medication such as Ubrelvy or Nurtec.  I suggested we start her on Ubrelvy as needed.  Her migraine frequency improved when she recently started Qsymia for weight loss.  She is advised to stay well-hydrated and well rested.  I would recommend we rule out obstructive sleep apnea with a sleep study.  She is willing to proceed.  She does report snoring and waking up with a headache at times.  She is advised to  consider CPAP or AutoPap therapy for sleep apnea if her sleep study determines that she indeed has obstructive sleep apnea which may be a contributor to her recurrent headaches.  She is familiar with the sleep apnea diagnosis and treatment with CPAP as her husband has machine.  I suggested 28-month follow-up routinely with one of our nurse practitioners.  I provided written instructions and a new prescription for Ubrelvy.  She is advised not to take Ubrelvy and Imitrex together, only 1 or the other.  She can continue to take Imitrex as needed.  I answered all their questions today and the patient and her husband were in agreement.   Huston Foley, MD, PhD

## 2020-01-12 ENCOUNTER — Telehealth: Payer: Self-pay

## 2020-01-12 NOTE — Telephone Encounter (Signed)
Patient has called me back. She has stated that her husband is retiring and she will be without insurance starting May 8th. She is not interested at this time to schedule. Once she gets insurance again, she will call me back to schedule at a later date.

## 2020-01-12 NOTE — Telephone Encounter (Signed)
LM with husband for pt to call me back to schedule sleep study  

## 2020-05-15 IMAGING — MR MR HEAD W/O CM
10 of 11 series · 43 of 48 positions shown · non-contrast
Comparison: Comparison made with prior CTs from 11/24/2019.

CLINICAL DATA: Initial evaluation for acute speech difficulty.

EXAM:
MRI HEAD WITHOUT CONTRAST
TECHNIQUE: Multiplanar, multiecho pulse sequences of the brain and surrounding
structures were obtained without intravenous contrast.

[Series 5: DWI · axial · 3.0mm · 0.88mm/px · z∈[-58,+92]mm · 10 of 102 slices shown (1 of 4)]
[im 1/102]
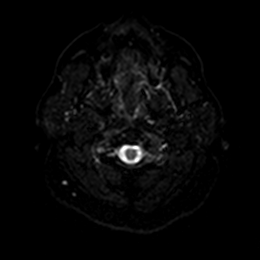
[im 12/102]
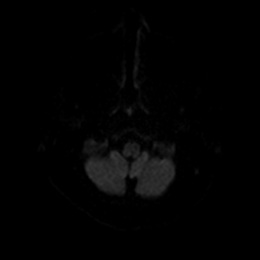
[im 23/102]
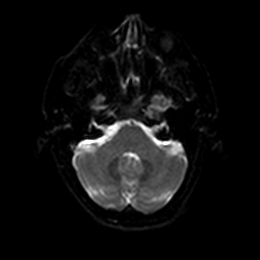
[im 34/102]
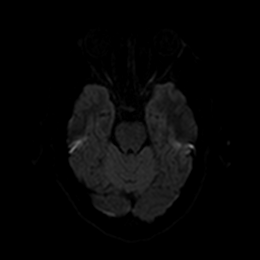
[im 45/102]
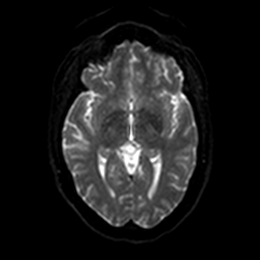
[im 57/102]
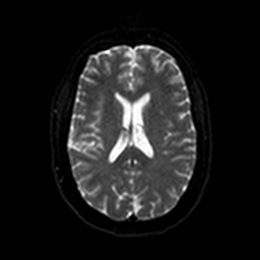
[im 68/102]
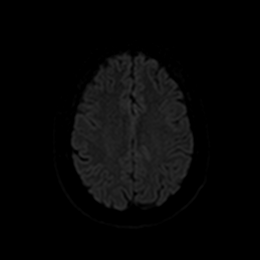
[im 79/102]
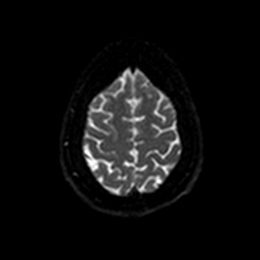
[im 90/102]
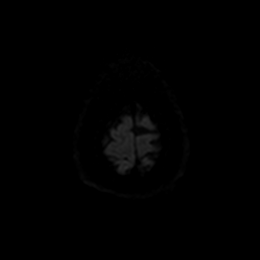
[im 102/102]
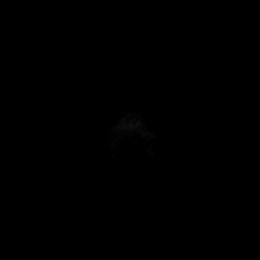

[Series 6: DWI · axial · 3.0mm · 0.88mm/px · z∈[-58,+92]mm · 5 of 51 slices shown (2 of 4)]
[im 1/51]
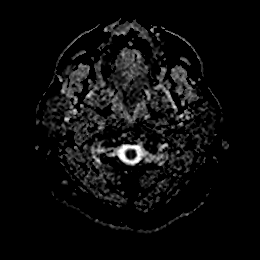
[im 13/51]
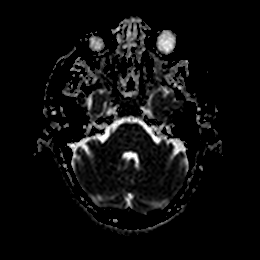
[im 26/51]
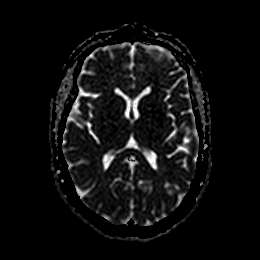
[im 38/51]
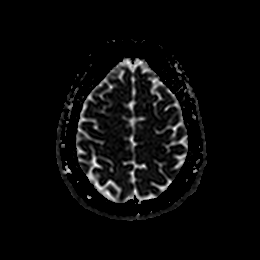
[im 51/51]
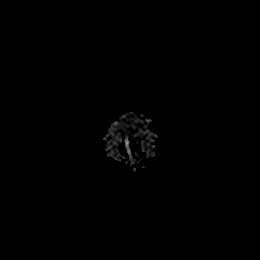

[Series 7: DWI · coronal · 4.0mm · 0.88mm/px · 6 of 70 slices shown (3 of 4)]
[im 1/70]
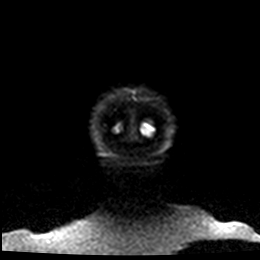
[im 14/70]
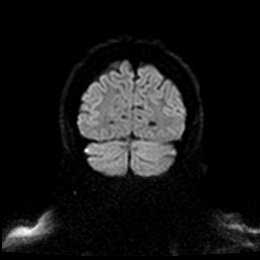
[im 28/70]
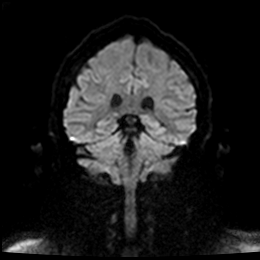
[im 42/70]
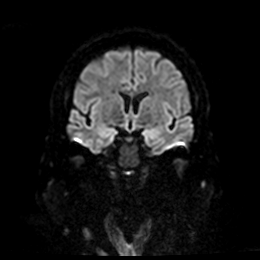
[im 56/70]
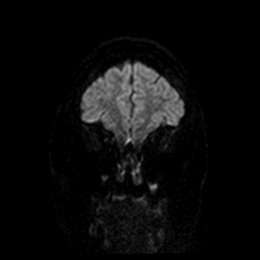
[im 70/70]
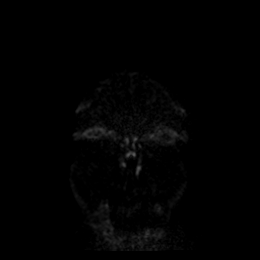

[Series 8: DWI · coronal · 4.0mm · 0.88mm/px · 3 of 35 slices shown (4 of 4)]
[im 1/35]
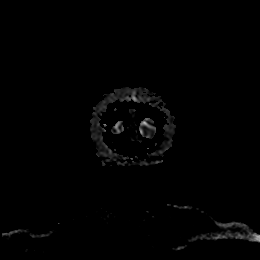
[im 18/35]
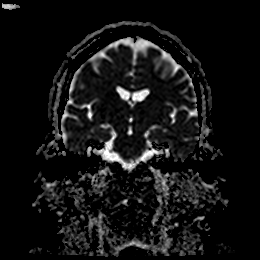
[im 35/35]
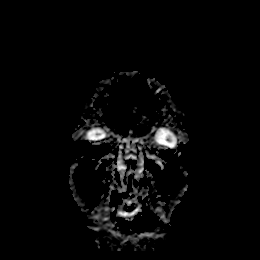

[Series 9: T1 · sagittal · 5.0mm · 0.75mm/px · 2 of 25 slices shown]
[im 1/25]
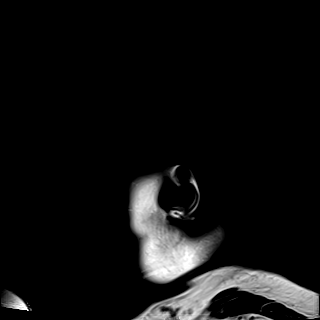
[im 25/25]
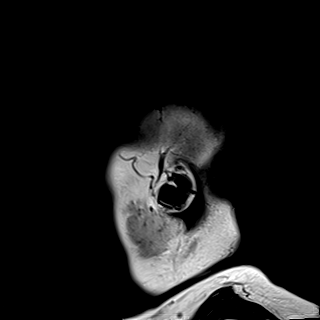

[Series 10: T2 · axial · 5.0mm · 0.72mm/px · z∈[-55,+89]mm · 2 of 25 slices shown (1 of 2)]
[im 1/25]
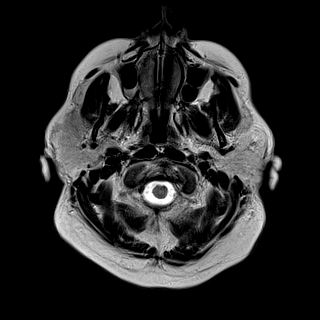
[im 25/25]
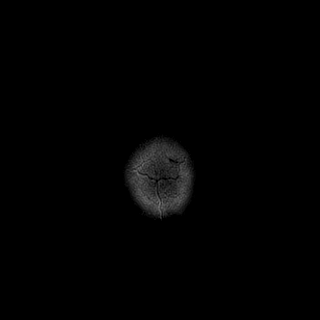

[Series 13: pha_images · axial · 3.0mm · 0.90mm/px · z∈[-68,+99]mm · 5 of 54 slices shown]
[im 1/54]
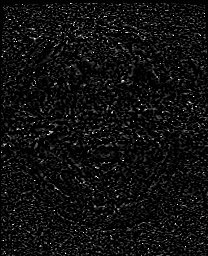
[im 14/54]
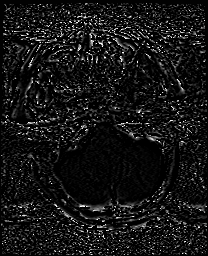
[im 27/54]
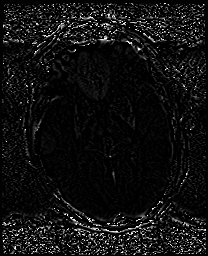
[im 40/54]
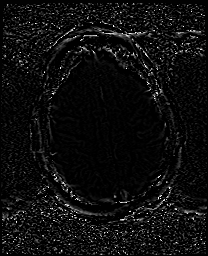
[im 54/54]
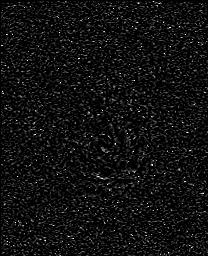

[Series 14: swi_images · axial · 3.0mm · 0.90mm/px · z∈[-71,+105]mm · 5 of 60 slices shown]
[im 1/60]
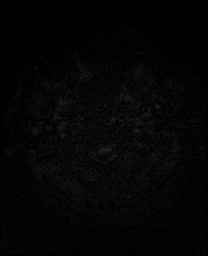
[im 15/60]
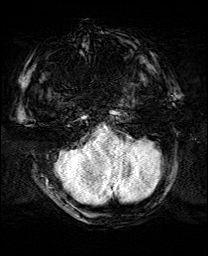
[im 30/60]
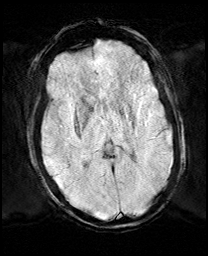
[im 45/60]
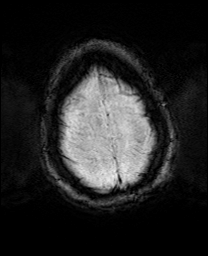
[im 60/60]
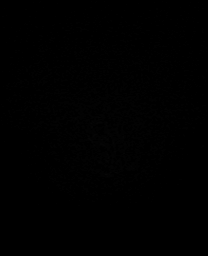

[Series 17: T2 · coronal · 5.0mm · 0.34mm/px · 3 of 29 slices shown (2 of 2)]
[im 1/29]
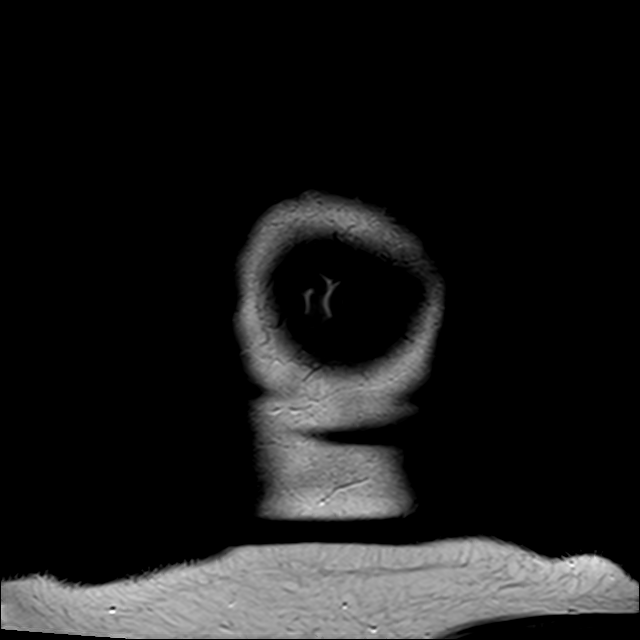
[im 15/29]
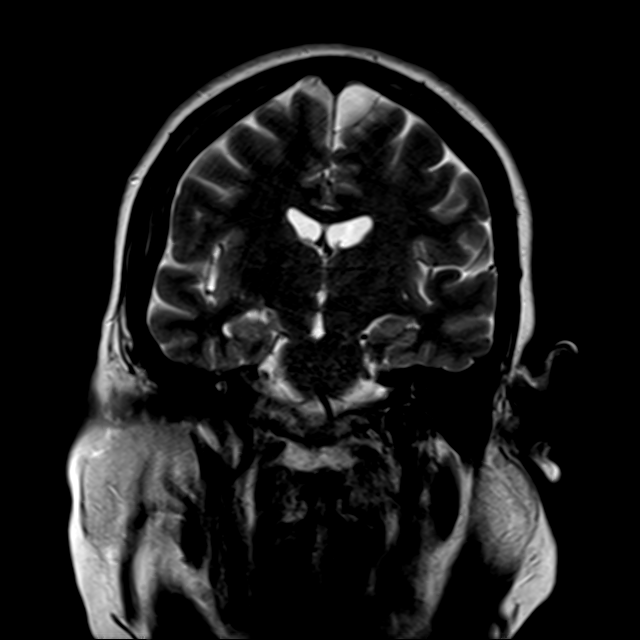
[im 29/29]
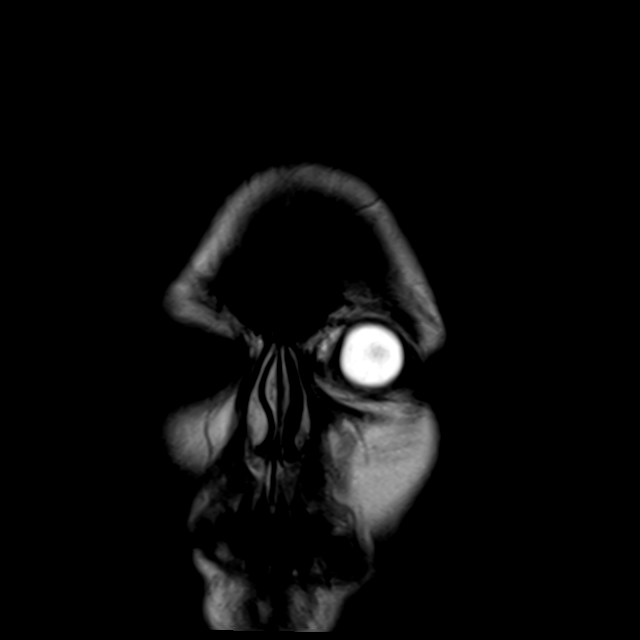

[Series 18: FLAIR · axial · 5.0mm · 0.45mm/px · z∈[-46,+97]mm · 2 of 25 slices shown]
[im 1/25]
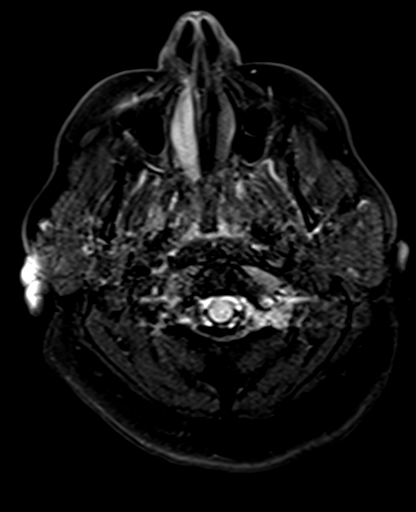
[im 25/25]
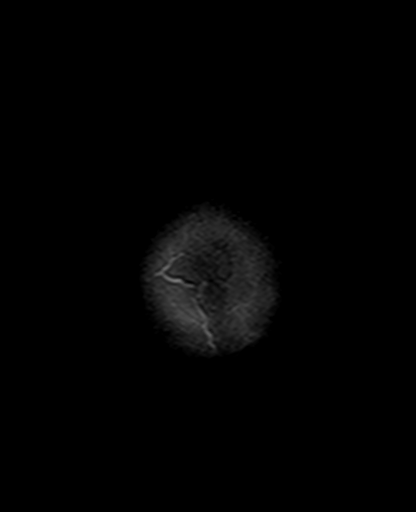

[43 of 48 positions shown; findings below may reference images not displayed]

FINDINGS: Brain: Cerebral volume within normal limits. No significant cerebral
white matter disease. No abnormal foci of restricted diffusion to
suggest acute or subacute ischemia. Gray-white matter
differentiation maintained. No encephalomalacia to suggest chronic
cortical infarction. No foci of susceptibility artifact to suggest
acute or chronic intracranial hemorrhage.

No mass lesion, midline shift or mass effect. No hydrocephalus. No
extra-axial fluid collection. Pituitary gland suprasellar region
normal. Midline structures intact.

Vascular: Major intracranial vascular flow voids are maintained.

Skull and upper cervical spine: Craniocervical junction within
normal limits. Upper cervical spine normal. Bone marrow signal
intensity within normal limits. No scalp soft tissue abnormality.

Sinuses/Orbits: Globes orbital soft tissues within normal limits.
Small amount of layering fluid noted within the right sphenoid
sinus. Paranasal sinuses are otherwise clear. No mastoid effusion.

Other: None.
IMPRESSION: Normal brain MRI for age. No acute intracranial infarct or other
abnormality.

## 2022-09-26 DIAGNOSIS — E611 Iron deficiency: Secondary | ICD-10-CM | POA: Insufficient documentation

## 2022-10-14 DIAGNOSIS — N920 Excessive and frequent menstruation with regular cycle: Secondary | ICD-10-CM | POA: Insufficient documentation

## 2022-10-14 DIAGNOSIS — R5383 Other fatigue: Secondary | ICD-10-CM | POA: Insufficient documentation

## 2024-01-05 ENCOUNTER — Encounter: Payer: Self-pay | Admitting: Neurology

## 2024-01-05 ENCOUNTER — Ambulatory Visit (INDEPENDENT_AMBULATORY_CARE_PROVIDER_SITE_OTHER): Payer: BC Managed Care – PPO | Admitting: Neurology

## 2024-01-05 VITALS — BP 114/79 | HR 62 | Ht 63.0 in | Wt 207.6 lb

## 2024-01-05 DIAGNOSIS — R0683 Snoring: Secondary | ICD-10-CM

## 2024-01-05 DIAGNOSIS — G43009 Migraine without aura, not intractable, without status migrainosus: Secondary | ICD-10-CM

## 2024-01-05 DIAGNOSIS — G43909 Migraine, unspecified, not intractable, without status migrainosus: Secondary | ICD-10-CM | POA: Diagnosis not present

## 2024-01-05 DIAGNOSIS — G4719 Other hypersomnia: Secondary | ICD-10-CM | POA: Diagnosis not present

## 2024-01-05 DIAGNOSIS — Z9189 Other specified personal risk factors, not elsewhere classified: Secondary | ICD-10-CM | POA: Diagnosis not present

## 2024-01-05 DIAGNOSIS — R519 Headache, unspecified: Secondary | ICD-10-CM

## 2024-01-05 DIAGNOSIS — Z789 Other specified health status: Secondary | ICD-10-CM

## 2024-01-05 MED ORDER — UBRELVY 50 MG PO TABS
50.0000 mg | ORAL_TABLET | ORAL | 3 refills | Status: AC | PRN
Start: 2024-01-05 — End: ?

## 2024-01-05 NOTE — Progress Notes (Signed)
 Subjective:    Patient ID: Sherri Hunt is a 48 y.o. female.  HPI    Debbra Fairy, MD, PhD [facility] [address] [address] [place], [place] [address]   Dear Avanell Bob,  I saw your patient, Sherri Hunt, upon your kind request in the neurology clinic today for evaluation of her recurrent headaches, concern for migraines.  The patient is accompanied by her husband today.  As you know, Sherri Hunt is a 48 year old female with an underlying medical history of iron deficiency vitamin D deficiency, vitamin B12 deficiency, anemia, anxiety, depression, recurrent headaches and obesity, who reports recurrent headaches for the past few years.  She reports a longstanding history of migraines.  She reports that her migraines occur about 2 or 3 times per month but she has other headaches including morning headaches.  She has daytime tiredness, has had associated blurry vision with her headaches.  She has not had an eye examination in about 2 years.  She had bariatric surgery in 2021, gastric sleeve.  She quit smoking in 2015 or 2016.  She has occasional trouble speaking when she has a headache but sometimes her husband reports that her trouble speaking seems to be independent of headaches.  She has not gone to the emergency room with any acute symptoms like these recently except for 2021.  She has tried Maxalt recently but does not believe it helped, previously tried Imitrex which did not help.  She does not recall if Ubrelvy  in the past had helped.  She is currently on BuSpar as needed and Celexa low-dose.  She drinks quite a bit of caffeine in the form of coffee with 3 or 4 shots of espresso in the morning, 1 soda per day, very little water, maybe 1 bottle per day.  She goes to bed between 8 and 9 and rise time is between 6:30 AM and 7 AM.  She takes occasional Tylenol or ibuprofen.  Her Epworth sleepiness score is 20 out of 24, fatigue severity score is 39 out of 63.  She snores  mildly per husband.  She denies any sudden onset of one-sided weakness or droopy face or numbness.  I had evaluated her for recurrent headaches in 2021.  I had started her on a trial of Ubrelvy  and she was on Imitrex at the time.  She was advised to proceed with a sleep study, she did not pursue testing at the time.  I reviewed your office note from 09/23/2023.  A brain MRI without contrast was ordered.  She was started on Maxalt as needed.  She reported headaches associated with facial tingling and vision changes.  She reports that she went for the MRI but could not completed.  Results are not available for my review today, I am not sure if there was a partial report on it.  She had blood work through your office on 05/23/2024 including CBC with differential, anemia profile, vitamin D, vitamin B12, TSH.  She is currently on BuSpar 10 mg twice daily as needed and citalopram 20 mg daily.  She currently does not take any supplements after her bariatric surgery.  She is followed by hematology for her iron deficiency and has received iron infusions from time to time.  Previously:   12/29/2019: 48 year old right-handed woman with an underlying history of migraines, depression and morbid obesity with a BMI of over 50 and reports having had migraines since elementary school days.  She has associated light sensitivity and nausea.  She  has tried Topamax for prevention in the past which was not helpful but since she started Qsymia she has found that her migraines are better.  She used to have a migraine twice a week on average and now she has 1-3 or maybe 4 times a month.  She also believes that stress has been a trigger for her migraines.  She has a stressful job.  She tries to hydrate better with water.  In the past, when she reduced her caffeine she noticed a flareup in her migraines but when she most recently stopped drinking sodas, she did not notice a significant flareup in her migraines.  She feels that her speech  is better.  Sometimes she has word finding difficulty.  She also has some daytime tiredness.  She reports that she noticed an increase in her daytime tiredness since she was diagnosed with Covid in January 2021.  She lives with her husband, they have 5 children.  She quit smoking in 2016 and currently limits her caffeine to 1 serving per day.  She is trying to lose weight.  She does snore.  She has woken up with a headache.  She does not have night to night nocturia.  She is familiar with the sleep apnea diagnosis since her husband has a CPAP machine but she herself has never had a sleep study.  She would be willing to pursue a sleep study if treatment of sleep apnea may help her migraines.   In the past, for acute management she has tried Maxalt, she is not sure if it helps, she tried Zomig which did not help.  She takes Imitrex as needed, has been on it for some years per primary care but it does make her feel jittery at times and also nauseated.   She typically has an eye examination once a year, last exam was 6 or 8 months ago.  She wears contacts.  She has had intermittent blurry vision.  Denies double vision.   She denies any sudden onset of one-sided weakness or numbness or tingling or droopy face.  Her husband felt that her left side of the mouth was droopy when she was in the ER but had resolved while in the ER.   In the ER, she had imaging tests, as well as teleneurology consult.  She had a head CT without contrast on 11/24/2019 and I reviewed the results: IMPRESSION: 1. Normal head CT 2. ASPECTS is 10. She had a CT angiogram of the head and neck with and without contrast on 11/24/2019 and I reviewed the results: IMPRESSION: 1. Normal CTA of the head and neck. No large vessel occlusion, hemodynamically significant stenosis, or other acute vascular abnormality. 2. Predominant fetal type origin of the PCAs with associated diminutive vertebrobasilar system. She had a brain MRI without contrast  on 11/25/2019 and I reviewed the results: IMPRESSION: Normal brain MRI for age. No acute intracranial infarct or other abnormality. Laboratory results included negative urinalysis, negative UDS and neg ethanol level. Symptomatic treatment in the emergency room included Decadron , Benadryl , Toradol , Reglan  and Ativan .   Her Past Medical History Is Significant For: Past Medical History:  Diagnosis Date   COVID-19 09/19/2019   Depression    Migraine     Her Past Surgical History Is Significant For: Past Surgical History:  Procedure Laterality Date   APPENDECTOMY     TONSILLECTOMY AND ADENOIDECTOMY      Her Family History Is Significant For: Family History  Problem Relation Age of Onset  Breast cancer Mother    Factor V Leiden deficiency Mother    Thyroid disease Mother    Healthy Father     Her Social History Is Significant For: Social History   Socioeconomic History   Marital status: Married    Spouse name: Not on file   Number of children: 5   Years of education: 12   Highest education level: High school graduate  Occupational History   Occupation: accounting  Tobacco Use   Smoking status: Former    Current packs/day: 0.00    Types: Cigarettes    Quit date: 2016    Years since quitting: 9.3   Smokeless tobacco: Never  Substance and Sexual Activity   Alcohol use: Not Currently   Drug use: Never   Sexual activity: Not on file  Other Topics Concern   Not on file  Social History Narrative   Lives at home with family.   Right-handed.   1 cup of tea or coffee per day.   Social Drivers of Corporate investment banker Strain: Not on file  Food Insecurity: Not on file  Transportation Needs: Not on file  Physical Activity: Not on file  Stress: Not on file  Social Connections: Unknown (06/19/2023)   [facility] from [facility]   Social Network    Social Network: Not on file    Her Allergies Are:  Allergies  Allergen Reactions   Sulfa Antibiotics      itching  :   Her Current Medications Are:  Outpatient Encounter Medications as of 01/05/2024  Medication Sig   citalopram (CELEXA) 40 MG tablet Take 40 mg by mouth daily.   cyanocobalamin (,VITAMIN B-12,) 1000 MCG/ML injection Inject 1,000 mcg into the muscle once a week. (Patient not taking: Reported on 01/05/2024)   furosemide (LASIX) 20 MG tablet Take 20 mg by mouth daily. (Patient not taking: Reported on 01/05/2024)   Phentermine-Topiramate (QSYMIA) 7.5-46 MG CP24 Take 1 capsule by mouth daily. (Patient not taking: Reported on 01/05/2024)   SUMAtriptan (IMITREX) 50 MG tablet Take 50 mg by mouth as needed. (Patient not taking: Reported on 01/05/2024)   Ubrogepant  (UBRELVY ) 50 MG TABS Take 50 mg by mouth as needed (may repeat once in 2 hours. no more than 2 pills in 24 h.). (Patient not taking: Reported on 01/05/2024)   Vitamin D, Ergocalciferol, (DRISDOL) 1.25 MG (50000 UNIT) CAPS capsule Take 50,000 Units by mouth once a week. (Patient not taking: Reported on 01/05/2024)   No facility-administered encounter medications on file as of 01/05/2024.  :   Review of Systems:  Out of a complete 14 point review of systems, all are reviewed and negative with the exception of these symptoms as listed below:  Review of Systems  Neurological:        Room 9 Pt is here with her Husband. Pt's husband states that pt has episodes where she can't form sentences sometimes and that can be with and without a migraine. Pt states that she gets frustrated when that happens and stutters. Pt's husbands states that in 2022 during she had COVID her symptoms started and she was babbling. Pt states that she starts to see spots and loses a little bit of her vision, and then her episodes starts. Pt's husbands states that when she has her episodes she will be exhausted. ESS 20 FSS 39    Objective:  Neurological Exam  Physical Exam Physical Examination:   Vitals:   01/05/24 1434  BP: 114/79  Pulse: 62  General  Examination: The patient is a very pleasant 48 y.o. female in no acute distress. She appears well-developed and well-nourished and well groomed.   HEENT: Normocephalic, atraumatic, pupils are equal, round and reactive to light, extraocular tracking is good without limitation to gaze excursion or nystagmus noted.  Funduscopic exam benign, no photophobia.  Hearing is grossly intact. Face is symmetric with normal facial animation and normal sensation.  Speech is clear without dysarthria, hypophonia or voice tremor.  There is no lip, neck/head, jaw or voice tremor. Neck is supple with full range of passive and active motion. There are no carotid bruits on auscultation. Oropharynx exam reveals: mild mouth dryness, adequate dental hygiene and moderate airway crowding, possible residual tonsillar tissue.  Chest: Clear to auscultation without wheezing, rhonchi or crackles noted.  Heart: S1+S2+0, regular and normal without murmurs, rubs or gallops noted.   Abdomen: Soft, non-tender and non-distended.  Extremities: There is no pitting edema in the distal lower extremities bilaterally.   Skin: Warm and dry without trophic changes noted.   Musculoskeletal: exam reveals no obvious joint deformities.   Neurologically:  Mental status: The patient is awake, alert and oriented in all 4 spheres. Her immediate and remote memory, attention, language skills and fund of knowledge are appropriate. There is no evidence of aphasia, agnosia, apraxia or anomia. Speech is clear with normal prosody and enunciation. Thought process is linear. Mood is normal and affect is normal.  Cranial nerves II - XII are as described above under HEENT exam.  Motor exam: Normal bulk, strength and tone is noted. There is no obvious action or resting tremor.  No drift or rebound, no postural or intention tremor. Fine motor skills and coordination: intact finger taps, hand movements and rapid alternating patting with both upper extremities,  normal foot taps bilaterally in the lower extremities.  Cerebellar testing: No dysmetria or intention tremor. There is no truncal or gait ataxia.  Normal finger-to-nose, normal heel-to-shin bilaterally. Sensory exam: intact to light touch in the upper and lower extremities.  Reflexes are 1+ throughout, toes are downgoing bilaterally. Romberg is negative. Gait, station and balance: She stands easily. No veering to one side is noted. No leaning to one side is noted. Posture is age-appropriate and stance is narrow based. Gait shows normal stride length and normal pace. No problems turning are noted.  Normal tandem walk.  Assessment and Plan:  In summary, Sherri Hunt is a very pleasant 48 y.o.-year old female with an underlying medical history of iron deficiency vitamin D deficiency, vitamin B12 deficiency, anemia, anxiety, depression, recurrent headaches and obesity, who presents for evaluation of her recurrent headaches of several years duration.  She may have a mixed type of headache including headaches secondary to strain on the eyes, caffeine overuse headaches, dehydration related headaches, sleep disordered breathing is a possibility and of course migrainous headaches.  I had a long discussion with the patient and her husband today.  She is advised to touch base with your office regarding the brain MRI and whether she could repeated.  She states today that her authorization may have expired and that she went for an MRI not too long ago but could not completed.  I am not sure if there is a partial report available on her recent brain MRI.    Neurologic exam is nonfocal and she is largely reassured today.  She is encouraged to restart her supplements as recommended by her bariatric team.  Below is a summary of my discussion points  from today's visit and my recommendations for the patient.  She was given these instructions verbally and also in her MyChart after visit summary which she will access  electronically as she verified.    << Please remember, common headache triggers are: sleep deprivation, dehydration, overheating, stress, hypoglycemia or skipping meals and blood sugar fluctuations, excessive pain medications or excessive alcohol use or caffeine withdrawal. Some people have food triggers such as aged cheese, orange juice or chocolate, especially dark chocolate, or MSG (monosodium glutamate). Try to avoid these headache triggers as much possible. It may be helpful to keep a headache diary to figure out what makes your headaches worse or brings them on and what alleviates them. Some people report headache onset after exercise but studies have shown that regular exercise may actually prevent headaches from coming. If you have exercise-induced headaches, please make sure that you drink plenty of fluid before and after exercising and that you do not over do it and do not overheat. Please increase your water intake to 3-4 bottles/day, 16.9 oz size each.  Reduce your caffeine intake to up to 1-2 servings/day, as caffeine can drive headaches.  Please schedule a formal eye exam with any optometrist or ophthalmologist of your choosing. Strain on the eyes can cause headaches.  Should you have any sudden onset of one-sided weakness, numbness, tingling, shortness of breath, chest pain, one-sided headache or severe headache, changes in mental state, please proceed to the ER or call 911 immediately. I will order a sleep study to look for signs of obstructive sleep apnea (aka OSA). As explained, the long-term risks and ramifications of untreated moderate to severe obstructive sleep apnea may include (but are not limited to): increased risk for cardiovascular disease, including congestive heart failure, stroke, difficult to control hypertension, treatment resistant obesity, arrhythmias, especially irregular heartbeat commonly known as A. Fib. (atrial fibrillation); even type 2 diabetes has been linked to  untreated OSA.  For headache as needed use, I recommend we restart you on Ubrelvy , 50 mg strength: Take 1 pill at onset of migraine headache, may repeat in 2 hours, no more than 4 pills in 24 hours, i.e. not to exceed 200 mg per 24 hours. May cause sedation and nausea.  We will plan a follow up in about 4 months. >>  I answered all their questions today and the patient and her husband were in agreement.    This was an extended visit of over 60 minutes due to addressing multiple issues, significant record review involved and considerable counseling and coordination of care. Thank you very much for allowing me to participate in the care of this nice patient. If I can be of any further assistance to you please do not hesitate to call me at [phone].  Sincerely,   Debbra Fairy, MD, PhD

## 2024-01-05 NOTE — Patient Instructions (Signed)
 It was nice to meet you today.   As discussed, your headaches are likely due to a combination of factors.   Here is what we discussed today and my recommendations for you:   Please remember, common headache triggers are: sleep deprivation, dehydration, overheating, stress, hypoglycemia or skipping meals and blood sugar fluctuations, excessive pain medications or excessive alcohol use or caffeine withdrawal. Some people have food triggers such as aged cheese, orange juice or chocolate, especially dark chocolate, or MSG (monosodium glutamate). Try to avoid these headache triggers as much possible. It may be helpful to keep a headache diary to figure out what makes your headaches worse or brings them on and what alleviates them. Some people report headache onset after exercise but studies have shown that regular exercise may actually prevent headaches from coming. If you have exercise-induced headaches, please make sure that you drink plenty of fluid before and after exercising and that you do not over do it and do not overheat. Please increase your water intake to 3-4 bottles/day, 16.9 oz size each.  Reduce your caffeine intake to up to 1-2 servings/day, as caffeine can drive headaches.  Please schedule a formal eye exam with any optometrist or ophthalmologist of your choosing. Strain on the eyes can cause headaches.  Should you have any sudden onset of one-sided weakness, numbness, tingling, shortness of breath, chest pain, one-sided headache or severe headache, changes in mental state, please proceed to the ER or call 911 immediately. I will order a sleep study to look for signs of obstructive sleep apnea (aka OSA). As explained, the long-term risks and ramifications of untreated moderate to severe obstructive sleep apnea may include (but are not limited to): increased risk for cardiovascular disease, including congestive heart failure, stroke, difficult to control hypertension, treatment resistant  obesity, arrhythmias, especially irregular heartbeat commonly known as A. Fib. (atrial fibrillation); even type 2 diabetes has been linked to untreated OSA.  For headache as needed use, I recommend we restart you on Ubrelvy , 50 mg strength: Take 1 pill at onset of migraine headache, may repeat in 2 hours, no more than 4 pills in 24 hours, i.e. not to exceed 200 mg per 24 hours. May cause sedation and nausea.  We will plan a follow up in about 4 months.

## 2024-01-06 ENCOUNTER — Other Ambulatory Visit (HOSPITAL_COMMUNITY): Payer: Self-pay

## 2024-01-06 ENCOUNTER — Telehealth: Payer: Self-pay | Admitting: Pharmacist

## 2024-01-06 NOTE — Telephone Encounter (Signed)
 Pharmacy Patient Advocate Encounter   Received notification from Patient Pharmacy that prior authorization for Ubrelvy  50MG  tablets is required/requested.   Insurance verification completed.   The patient is insured through [facility] .   Per test claim: PA required; PA submitted to above mentioned insurance via CoverMyMeds Key/confirmation #/EOC [id number] Status is pending

## 2024-01-06 NOTE — Telephone Encounter (Signed)
 Pharmacy Patient Advocate Encounter  Received notification from [facility] that Prior Authorization for Ubrelvy  50MG  tablets has been APPROVED from 01/06/2024 to 01/05/2025   PA #/Case ID/Reference #:  [id number]

## 2024-01-16 ENCOUNTER — Ambulatory Visit (INDEPENDENT_AMBULATORY_CARE_PROVIDER_SITE_OTHER): Admitting: Neurology

## 2024-01-16 DIAGNOSIS — Z9189 Other specified personal risk factors, not elsewhere classified: Secondary | ICD-10-CM

## 2024-01-16 DIAGNOSIS — R0683 Snoring: Secondary | ICD-10-CM

## 2024-01-16 DIAGNOSIS — G43009 Migraine without aura, not intractable, without status migrainosus: Secondary | ICD-10-CM

## 2024-01-16 DIAGNOSIS — G43909 Migraine, unspecified, not intractable, without status migrainosus: Secondary | ICD-10-CM

## 2024-01-16 DIAGNOSIS — G4733 Obstructive sleep apnea (adult) (pediatric): Secondary | ICD-10-CM | POA: Diagnosis not present

## 2024-01-16 DIAGNOSIS — G4719 Other hypersomnia: Secondary | ICD-10-CM

## 2024-01-16 DIAGNOSIS — R519 Headache, unspecified: Secondary | ICD-10-CM

## 2024-01-16 DIAGNOSIS — Z789 Other specified health status: Secondary | ICD-10-CM

## 2024-02-02 NOTE — Progress Notes (Signed)
 See procedure note.

## 2024-02-17 ENCOUNTER — Ambulatory Visit: Payer: Self-pay | Admitting: Neurology

## 2024-02-17 DIAGNOSIS — G4733 Obstructive sleep apnea (adult) (pediatric): Secondary | ICD-10-CM

## 2024-02-17 NOTE — Procedures (Signed)
 [facility]  HOME SLEEP TEST (SANSA) REPORT (Mail-Out Device):   STUDY DATE: 01/26/24  DOB: 10-Dec-1975  MRN: [id number]  ORDERING CLINICIAN: Debbra Fairy, MD, PhD   REFERRING CLINICIAN: Bentley Bray, PA   CLINICAL INFORMATION/HISTORY: 48 year old female with an underlying medical history of iron deficiency vitamin D deficiency, vitamin B12 deficiency, anemia, anxiety, depression, recurrent headaches and obesity, who reports recurrent headaches for the past few years. She reports a longstanding history of migraines. She reports that her migraines occur about 2 or 3 times per month but she has other headaches including morning headaches. She is reported to snore.  PATIENT'S LAST REPORTED EPWORTH SLEEPINESS SCORE (ESS): 20/24.  BMI (at the time of sleep clinic visit and/or test date): 36.8 kg/m  FINDINGS:   Study Protocol:    The SANSA single-point-of-skin-contact chest-worn sensor - an FDA cleared and DOT approved type 4 home sleep test device - measures eight physiological channels,  including blood oxygen saturation (measured via PPG [photoplethysmography]), EKG-derived heart rate, respiratory effort, chest movement (measured via accelerometer), snoring, body position, and actigraphy. The device is designed to be worn for up to 10 hours per study.   Sleep Summary:   Total Recording Time (hours, min): 7 hours, 42 min  Total Effective Sleep Time (hours, min):  5 hours, 34 min  Sleep Efficiency (%):    72%   Respiratory Indices:   Calculated sAHI (per hour):  8.8/hour         Oxygen Saturation Statistics:    Oxygen Saturation (%) Mean: 96.2%   Minimum oxygen saturation (%):                 72.2%   O2 Saturation Range (%): 72.2- 100%   Time below or at 88% saturation: 2 min   Pulse Rate Statistics:   Pulse Mean (bpm):    53/min    Pulse Range (42 - 120/min)   Snoring: Intermittent, mild  IMPRESSION/DIAGNOSES:   OSA (obstructive sleep  apnea)   RECOMMENDATIONS:   This home sleep test demonstrates overall mild obstructive sleep apnea with a total AHI of 8.8/hour but with an oxygen desaturation nadir of 72.2%.  Intermittent mild snoring was detected.  Given the patient's medical history and sleep related complaints, therapy with a positive airway pressure device is clinically recommended. Treatment can be achieved in the form of autoPAP trial/titration at home for now. A full night, in-lab PAP titration study may aid in improving proper treatment settings and with mask fit, if needed, down the road. Alternative treatments may include weight loss (where appropriate) along with avoidance of the supine sleep position (if possible), or an oral appliance in appropriate candidates.   Please note that untreated obstructive sleep apnea may carry additional perioperative morbidity. Patients with significant obstructive sleep apnea should receive perioperative PAP therapy and the surgeons and particularly the anesthesiologist should be informed of the diagnosis and the severity of the sleep disordered breathing. The patient should be cautioned not to drive, work at heights, or operate dangerous or heavy equipment when tired or sleepy. Review and reiteration of good sleep hygiene measures should be pursued with any patient. Other causes of the patient's symptoms, including circadian rhythm disturbances, an underlying mood disorder, medication effect and/or an underlying medical problem cannot be ruled out based on this test. Clinical correlation is recommended.  The patient and her referring provider will be notified of the test results. The patient will be seen in follow up in sleep clinic at  [facility], as necessary.  I certify that I have reviewed the raw data recording prior to the issuance of this report in accordance with the standards of the American Academy of Sleep Medicine (AASM).    INTERPRETING PHYSICIAN:   Debbra Fairy, MD, PhD Medical  Director, [facility]) Diplomat, ABPN (Neurology and Sleep)   [facility] [address] [place], [place] [address] [phone]

## 2024-02-17 NOTE — Patient Instructions (Signed)
 [facility]  HOME SLEEP TEST (SANSA) REPORT (Mail-Out Device):   STUDY DATE: 01/26/24  DOB: May 27, 1976  MRN: [id number]  ORDERING CLINICIAN: Debbra Fairy, MD, PhD   REFERRING CLINICIAN: Bentley Bray, PA   CLINICAL INFORMATION/HISTORY: 48 year old female with an underlying medical history of iron deficiency vitamin D deficiency, vitamin B12 deficiency, anemia, anxiety, depression, recurrent headaches and obesity, who reports recurrent headaches for the past few years. She reports a longstanding history of migraines. She reports that her migraines occur about 2 or 3 times per month but she has other headaches including morning headaches. She is reported to snore.  PATIENT'S LAST REPORTED EPWORTH SLEEPINESS SCORE (ESS): 20/24.  BMI (at the time of sleep clinic visit and/or test date): 36.8 kg/m  FINDINGS:   Study Protocol:    The SANSA single-point-of-skin-contact chest-worn sensor - an FDA cleared and DOT approved type 4 home sleep test device - measures eight physiological channels,  including blood oxygen saturation (measured via PPG [photoplethysmography]), EKG-derived heart rate, respiratory effort, chest movement (measured via accelerometer), snoring, body position, and actigraphy. The device is designed to be worn for up to 10 hours per study.   Sleep Summary:   Total Recording Time (hours, min): 7 hours, 42 min  Total Effective Sleep Time (hours, min):  5 hours, 34 min  Sleep Efficiency (%):    72%   Respiratory Indices:   Calculated sAHI (per hour):  8.8/hour         Oxygen Saturation Statistics:    Oxygen Saturation (%) Mean: 96.2%   Minimum oxygen saturation (%):                 72.2%   O2 Saturation Range (%): 72.2- 100%   Time below or at 88% saturation: 2 min   Pulse Rate Statistics:   Pulse Mean (bpm):    53/min    Pulse Range (42 - 120/min)   Snoring: Intermittent, mild  IMPRESSION/DIAGNOSES:   OSA (obstructive sleep  apnea)   RECOMMENDATIONS:   This home sleep test demonstrates overall mild obstructive sleep apnea with a total AHI of 8.8/hour with oxygen desaturation nadir of 72.2%.  Intermittent mild snoring was detected.  Given the patient's medical history and sleep related complaints, therapy with a positive airway pressure device is clinically recommended. Treatment can be achieved in the form of autoPAP trial/titration at home for now. A full night, in-lab PAP titration study may aid in improving proper treatment settings and with mask fit, if needed, down the road. Alternative treatments may include weight loss (where appropriate) along with avoidance of the supine sleep position (if possible), or an oral appliance in appropriate candidates.   Please note that untreated obstructive sleep apnea may carry additional perioperative morbidity. Patients with significant obstructive sleep apnea should receive perioperative PAP therapy and the surgeons and particularly the anesthesiologist should be informed of the diagnosis and the severity of the sleep disordered breathing. The patient should be cautioned not to drive, work at heights, or operate dangerous or heavy equipment when tired or sleepy. Review and reiteration of good sleep hygiene measures should be pursued with any patient. Other causes of the patient's symptoms, including circadian rhythm disturbances, an underlying mood disorder, medication effect and/or an underlying medical problem cannot be ruled out based on this test. Clinical correlation is recommended.  The patient and her referring provider will be notified of the test results. The patient will be seen in follow up in sleep clinic at [facility], as  necessary.  I certify that I have reviewed the raw data recording prior to the issuance of this report in accordance with the standards of the American Academy of Sleep Medicine (AASM).    INTERPRETING PHYSICIAN:   Debbra Fairy, MD, PhD Medical  Director, [facility]) Diplomat, ABPN (Neurology and Sleep)   [facility] [address] [place], [place] [address] [phone]

## 2024-02-25 NOTE — Telephone Encounter (Addendum)
 Called pt and discussed sleep study results and recommendations as noted below. The patient states they just lost their insurance. She will not be treating with cpap. I did offer to patient for us  to send the order to a DME which would not commit her but would give her opportunity to discuss cash pricing with DME and she declined. I went over possible negative effects of leaving sleep apnea untreated such as continued headaches/migraines, memory trouble, and in severe cases heart attack or stroke as sleep apnea can worsen over time. Pt has a f/u with us  in August. She states she's had migraines since she was a teen and she gets them every day no matter what she does regarding diet, etc. I told her I would let MD know she had elected not to treat her sleep apnea and we will call her back if any comments/further recommendations made. Pt verbalized understanding and appreciation for the call. She would like the results mailed to her home (address confirmed).

## 2024-02-25 NOTE — Telephone Encounter (Signed)
-----   Message from Debbra Fairy sent at 02/17/2024  6:56 PM EDT ----- Patient referred by PCP, seen by me on 01/05/2024, patient had HST on 01/26/2024.    Please call and notify the patient that the recent home sleep test showed obstructive sleep apnea. OSA is overall mild, but worth treating to see if she feels better after treatment. To that end I recommend treatment for this in the form of autoPAP, which means, that we don't have to bring her in for a sleep study with CPAP, but will let her try an autoPAP machine at home, through a DME company (of her choice, or as per insurance requirement). The DME representative will educate her on how to use the machine, how to put the mask on, etc. I have placed an order in the chart. Please send referral, talk to patient, send report to referring MD. We will need a FU in sleep clinic in about 2.-3 months post-PAP set up (which is usually an insurance-mandated appointment to monitor compliance), please arrange that with me or one of our NPs. Please also go over the need for compliance with treatment (including the insurance-imposed minimum compliance percentage). Thanks,   Debbra Fairy, MD, PhD [facility])

## 2024-04-07 NOTE — Progress Notes (Signed)
 Assessment and Plan:  1. Obesity (BMI 30-39.9) Continue working on weight loss and start [facility] along with diet/exercise. - semaglutide, weight loss, (Wegovy) 0.25 mg/0.5 mL subcutaneous pen injector; Inject 0.5 mL (0.25 mg total) under the skin every 7 days.  Dispense: 2 mL; Refill: 3 - Ambulatory referral to Plastic Surgery; Future  2. Primary insomnia (Primary) Trial of Sonata. - zaleplon (SONATA) 10 mg cap capsule; Take 1 capsule (10 mg total) by mouth nightly as needed for sleep.  Dispense: 15 capsule; Refill: 5  3. Candidal intertrigo Consult for abdominoplasty to help with skin removal since lost weight after bariatric surgery. - Ambulatory referral to Plastic Surgery; Future  4. Moderate episode of recurrent major depressive disorder (HCC) Continue Celexa   Plan: Patient expresses understanding of their current medications and use.  If a new prescription was given today, then I discussed potential side effects, drug interactions, instructions for taking the medication, and the consequences of not taking it.   Patient verbalized an understanding of these instructions. Patient is able to verbalize understanding of the care plan discussed today. Patient's medical and personal goals were discussed today. Barriers to current goals:  None Follow up as scheduled next month.   HPI:  Patient returns for ongoing care, monitoring and treatment of their medical conditions.   Sherri Hunt is a 48 y.o. female here for Depression.  She started taking Buspar and Celexa about a week ago. Having increased anxiety and depression since her youngest daughter left home at 66yo with girl she met on-line.  She is feeling better since getting Celexa back in system.  Less emotional but difficulty sleeping.  Depression/Anxiety Patient presents for depression/anxiety follow up.  Currently medications reviewed.  Doing well w/o side effects.  Taking medication daily as prescribed.  Current  symtpoms: depressed mood, panic attacks, disturbed sleep, and decreased appetite. Patient denies: Intrusive Thoughts and Racing Heart. Patient states that overall, she feels very depressed and anxious.   Weight Loss Patient presents for follow up of her physician assisted weight loss efforts. Has tried in the past: exercise program and fad diets. Currently not taking Wegovy due to needing updated BMI.  Exercise level: moderately active. Eating a low calorie diet: yes. Has lost 2# since last follow up.   Patient has had bariatric surgery and doing well on her weight loss journey.  She would like to see specialist to consider abdominoplasty due to extra abdominal skin folds causing recurrent rashes.  Using antifungal and steroid creams with mild relief. Wt Readings from Last 3 Encounters:  04/07/24 91.6 kg (202 lb)  01/26/24 92.1 kg (203 lb)  12/23/23 91.2 kg (201 lb)   Past Medical/Surgical History:   Medical History[1] Surgical History[2]  Family History:   Family History[3]  Social History:   Social History[4]  Allergies:   Sulfa (sulfonamide antibiotics) and Sulfasalazine  Current Medications:   Current Medications[5]  Health Maintenance:    Immunizations:    There is no immunization history on file for this patient.  I have reviewed and (if needed) updated the patient's problem list, medications, allergies, past medical and surgical history, social and family history.  ROS:   Review of Systems  Constitutional:  Negative for chills, fatigue and fever.  HENT:  Negative for congestion.   Eyes:  Negative for visual disturbance.  Respiratory:  Negative for shortness of breath.   Cardiovascular:  Negative for chest pain and palpitations.  Gastrointestinal:  Negative for abdominal pain.  Endocrine: Negative for cold  intolerance and heat intolerance.  Genitourinary:  Negative for difficulty urinating.  Musculoskeletal:  Negative for arthralgias.  Skin:  Positive for  rash.  Neurological:  Negative for dizziness and weakness.  Hematological:  Negative for adenopathy.  Psychiatric/Behavioral:  Positive for sleep disturbance. Negative for dysphoric mood. The patient is nervous/anxious.     Vital Signs:   Wt Readings from Last 3 Encounters:  04/07/24 91.6 kg (202 lb)  01/26/24 92.1 kg (203 lb)  12/23/23 91.2 kg (201 lb)   Temp Readings from Last 3 Encounters:  04/07/24 97.8 F (36.6 C)  01/26/24 97.5 F (36.4 C)  12/23/23 98 F (36.7 C)   BP Readings from Last 3 Encounters:  04/07/24 133/81  01/26/24 99/72  12/23/23 137/74   Pulse Readings from Last 3 Encounters:  04/07/24 64  01/26/24 70  12/23/23 61     BP 133/81   Pulse 64   Temp 97.8 F (36.6 C)   Resp 16   Ht 1.6 m (5' 3)   Wt 91.6 kg (202 lb)   SpO2 98%   BMI 35.78 kg/m  Body mass index is 35.78 kg/m.    Objective:  Physical Exam General Appearance:  Alert and oriented x 3; cooperative and in no distress.  Well-developed and well-nourished obese white female.  Sitting comfortably and conversing normally. Head:  Normocephalic, without obvious abnormality, atraumatic   Eyes:  Conjunctiva/corneas clear, EOM's intact  Neck: Supple, symmetrical, trachea midline;  no carotid bruits  Lymphatics: Cervical, supraclavicular and axillary nodes normal Thyroid:  No thyroid enlargement/tenderness/nodules Heart: Regular rate and rhythm, S1, S2 normal; no murmur, click, rub or gallop   Lungs: Clear to auscultation bilaterally without wheezing. Musculoskeletal: Normal range of motion of upper and lower extremities.  Muscle strength is grossly normal.  No redness, swelling or tenderness of joints.  No deviation of spine.  No edema. Neurologic: Grossly normal. Cranial nerves II-XII intact, normal strength, sensation and reflexes throughout. Gait is normal. Pulses: 2+ symmetric. Skin: Skin color, texture, turgor normal. No rashes or lesions.  Psychiatric: She has an anxious mood and  affect.  Her speech is normal and nonpressured.  Thought content is normal.   Recent lab work reviewed and analyzed: Lab Results  Component Value Date   WBC 6.73 03/23/2024   HGB 13.4 03/23/2024   HCT 39.6 03/23/2024   PLT 324 03/23/2024   CHOL 208 (H) 02/28/2022   TRIG 95 02/28/2022   HDL 55 02/28/2022   ALT 9 98/92/7974   AST 15 09/23/2023   NA 138 09/23/2023   K 4.5 09/23/2023   CL 104 09/23/2023   CREATININE 0.89 09/23/2023   BUN 8 09/23/2023   CO2 28 09/23/2023   TSH 1.259 09/23/2023   GLUCOSE 84 09/23/2023   HGBA1C 5.2 09/23/2023   VITD 20.3 (L) 09/23/2023   VITAMINB12 184 12/23/2023    Recent imaging reviewed and analyzed: XR Historical Ordered by an unspecified provider.   This document serves as a record of services personally performed by Katheryn Billing, PA-C.  It was created on their behalf by Rosaline Gauss, CMA, a trained medical scribe, and Certified Medical Assistant (CMA). During the course of documenting the history, physical exam and medical decision making, I was functioning as a Stage manager. The creation of this record is the provider's dictation and/or activities during the visit.   Electronically signed by Rosaline Gauss, CMA 04/07/2024 10:03 AM   I agree the documentation is accurate and complete.         [  1] Past Medical History: Diagnosis Date  . Anxiety   . Depression   . Migraines   . 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 APPENDECTOMY     Procedure: APPENDECTOMY  . TONSILLECTOMY AND ADENOIDECTOMY     Procedure: TONSILLECTOMY AND ADENOIDECTOMY  [3] Family History Problem Relation Name Age of Onset  . Blood disease Mother    . Cancer Mother         Breast cancer  [4] Social History Socioeconomic History  . Marital status: Married  Tobacco Use  . Smoking status: Former  . Smokeless tobacco: Never  Substance and Sexual Activity  .  Alcohol use: Yes  . Drug use: No   Social Drivers of Health   Safety: Low Risk  (04/07/2024)   Safety   . How often does anyone, including family and friends, physically hurt you?: Never   . How often does anyone, including family and friends, insult or talk down to you?: Never   . How often does anyone, including family and friends, threaten you with harm?: Never   . How often does anyone, including family and friends, scream or curse at you?: Never  [5] Current Outpatient Medications  Medication Sig Dispense Refill  . busPIRone (BUSPAR) 10 mg tablet Take 1 tablet (10 mg total) by mouth 2 (two) times a day as needed (anxiety). 60 tablet 3  . citalopram (CeleXA) 20 mg tablet Take 1 tablet (20 mg total) by mouth daily Indications: psychiatric disorder. 90 tablet 3  . betamethasone dipropionate (DIPROSONE) 0.05 % lotion Apply topically 2 (two) times a day. 60 mL 3  . cholecalciferol (VITAMIN D3) 1,250 mcg (50,000 unit) capsule Take 1 each (50,000 Units total) by mouth every 7 days. (Patient not taking: Reported on 04/07/2024) 12 capsule 3  . LORazepam  (ATIVAN ) 1 mg tablet Take 1 tablet (1 mg total) by mouth daily as needed for anxiety (take 1 HOUR before Procedure.  Redose if needed.). (Patient not taking: Reported on 04/07/2024) 4 tablet 0  . semaglutide, weight loss, (Wegovy) 0.25 mg/0.5 mL subcutaneous pen injector Inject 0.5 mL (0.25 mg total) under the skin every 7 days. 2 mL 3  . ubrogepant  (Ubrelvy ) 50 mg tablet Take 50 mg by mouth. (Patient not taking: Reported on 04/07/2024)    . zaleplon (SONATA) 10 mg cap capsule Take 1 capsule (10 mg total) by mouth nightly as needed for sleep. 15 capsule 5   No current facility-administered medications for this visit.

## 2024-04-16 ENCOUNTER — Encounter (HOSPITAL_BASED_OUTPATIENT_CLINIC_OR_DEPARTMENT_OTHER): Payer: Self-pay

## 2024-04-16 ENCOUNTER — Emergency Department (HOSPITAL_BASED_OUTPATIENT_CLINIC_OR_DEPARTMENT_OTHER)
Admission: EM | Admit: 2024-04-16 | Discharge: 2024-04-17 | Disposition: A | Discharge status: Home / Self Care | Attending: Emergency Medicine | Admitting: Emergency Medicine

## 2024-04-16 ENCOUNTER — Other Ambulatory Visit: Payer: Self-pay

## 2024-04-16 DIAGNOSIS — I809 Phlebitis and thrombophlebitis of unspecified site: Secondary | ICD-10-CM | POA: Insufficient documentation

## 2024-04-16 DIAGNOSIS — Z8616 Personal history of COVID-19: Secondary | ICD-10-CM | POA: Insufficient documentation

## 2024-04-16 DIAGNOSIS — M7989 Other specified soft tissue disorders: Secondary | ICD-10-CM | POA: Diagnosis present

## 2024-04-16 NOTE — ED Provider Notes (Signed)
 [place] [facility] Provider Note   CSN: [id number] Arrival date & time: 04/16/24  2052     Patient presents with: Arm Swelling   Sherri Hunt is a 48 y.o. female otherwise healthy presents with complaints of concern for blood clot in her left upper extremity.  Patient states she donated plasma yesterday and the site where they stuck her left upper extremity is painful and is appearing more red.  She has no history of DVT.  She is without any chest pain or shortness of breath.   HPI    Past Medical History:  Diagnosis Date   COVID-19 09/19/2019   Depression    Migraine      Prior to Admission medications   Medication Sig Start Date End Date Taking? Authorizing Provider  citalopram (CELEXA) 40 MG tablet Take 40 mg by mouth daily. 12/22/19   [provider]  cyanocobalamin (,VITAMIN B-12,) 1000 MCG/ML injection Inject 1,000 mcg into the muscle once a week. Patient not taking: Reported on 01/05/2024 10/24/19   [provider]  furosemide (LASIX) 20 MG tablet Take 20 mg by mouth daily. Patient not taking: Reported on 01/05/2024 12/16/19   [provider]  Phentermine-Topiramate (QSYMIA) 7.5-46 MG CP24 Take 1 capsule by mouth daily. Patient not taking: Reported on 01/05/2024 09/03/19   [provider]  SUMAtriptan (IMITREX) 50 MG tablet Take 50 mg by mouth as needed. Patient not taking: Reported on 01/05/2024 08/19/19   [provider]  Ubrogepant  (UBRELVY ) 50 MG TABS Take 1 tablet (50 mg total) by mouth as needed (may repeat once in 2 hours. no more than 2 pills in 24 h.). 01/05/24   Athar, Saima, MD  Vitamin D, Ergocalciferol, (DRISDOL) 1.25 MG (50000 UNIT) CAPS capsule Take 50,000 Units by mouth once a week. Patient not taking: Reported on 01/05/2024 12/16/19   [provider]    Allergies: Sulfa antibiotics    Review of Systems  Musculoskeletal:  Positive for myalgias.    Updated Vital Signs BP  125/71 (BP Location: Right Arm)   Pulse 65   Temp 97.7 F (36.5 C) (Oral)   Resp 16   Ht 5' 4 (1.626 m)   Wt 92.5 kg   LMP  (LMP Unknown)   SpO2 100%   BMI 35.02 kg/m   Physical Exam Vitals and nursing note reviewed.  Constitutional:      General: She is not in acute distress.    Appearance: She is well-developed.  HENT:     Head: Normocephalic and atraumatic.  Eyes:     Conjunctiva/sclera: Conjunctivae normal.  Cardiovascular:     Pulses: Normal pulses.  Pulmonary:     Effort: Pulmonary effort is normal. No respiratory distress.  Musculoskeletal:     Cervical back: Neck supple.     Comments: Left AC with mild erythema and ecchymosis, minimal swelling, compartments are soft, no crepitus, tolerates full range of motion of the extremity, radial pulse 2+  Skin:    General: Skin is warm and dry.     Capillary Refill: Capillary refill takes less than 2 seconds.  Neurological:     Mental Status: She is alert.  Psychiatric:        Mood and Affect: Mood normal.     (all labs ordered are listed, but only abnormal results are displayed) Labs Reviewed - No data to display  EKG: None  Radiology: No results found.   Procedures   Medications Ordered in the ED -  No data to display                                  Medical Decision Making  This patient presents to the ED with chief complaint(s) of left elbow pain.  The complaint involves an extensive differential diagnosis and also carries with it a high risk of complications and morbidity.   Pertinent past medical history as listed in HPI  The differential diagnosis includes  Superficial thrombophlebitis, DVT, cellulitis, abscess Additional history obtained: Records reviewed Care Everywhere/External Records  Assessment and management:   Hemodynamically stable, nontoxic-appearing patient presenting with complaints of pain, redness, swelling at the site of her plasma drawl yesterday.  On exam there is minimal erythema,  ecchymosis and swelling at her left AC.  There is no increased warmth.  Do not suspect cellulitis.  Do not suspect DVT.  Overall most consistent clinically with superficial thrombophlebitis.  Have recommended NSAIDs.  Do not feel that any labs or imaging are indicated at this time.  I provided strict return precautions including worsening pain, swelling, redness, fevers, chills, chest pain or shortness of breath.  Patient is understanding and agreement with plan.  Independent ECG interpretation:  none  Independent labs interpretation:  The following labs were independently interpreted:  none  Independent visualization and interpretation of imaging: I independently visualized the following imaging with scope of interpretation limited to determining acute life threatening conditions related to emergency care: none    Consultations obtained:   none  Disposition:   Patient will be discharged home. The patient has been appropriately medically screened and/or stabilized in the ED. I have low suspicion for any other emergent medical condition which would require further screening, evaluation or treatment in the ED or require inpatient management. At time of discharge the patient is hemodynamically stable and in no acute distress. I have discussed work-up results and diagnosis with patient and answered all questions. Patient is agreeable with discharge plan. We discussed strict return precautions for returning to the emergency department and they verbalized understanding.     Social Determinants of Health:   none  This note was dictated with voice recognition software.  Despite best efforts at proofreading, errors may have occurred which can change the documentation meaning.       Final diagnoses:  Thrombophlebitis    ED Discharge Orders     None          Donnajean Lynwood VEAR DEVONNA 04/16/24 2256    Dreama Longs, MD 04/17/24 [phone]

## 2024-04-16 NOTE — Discharge Instructions (Signed)
 Your exam was consistent with superficial thrombophlebitis.  This is a common and benign inflammation that can occur following a a blood draw.  Please use ibuprofen as needed for pain.  If you experience any new or worsening symptoms including worsening redness, swelling, pain, fevers, chills, chest pain or shortness of breath please return to emergency room.

## 2024-04-16 NOTE — ED Triage Notes (Signed)
 Pt states she donated plasma yesterday & today she has a knot, bruise in her LAC where they drew it. States it is warm to the touch, painful.

## 2024-04-20 ENCOUNTER — Ambulatory Visit: Admitting: Neurology

## 2024-04-20 ENCOUNTER — Other Ambulatory Visit (HOSPITAL_COMMUNITY): Payer: Self-pay

## 2024-04-20 ENCOUNTER — Telehealth: Payer: Self-pay

## 2024-04-20 VITALS — BP 112/88 | HR 53 | Ht 64.0 in | Wt 205.6 lb

## 2024-04-20 DIAGNOSIS — G43909 Migraine, unspecified, not intractable, without status migrainosus: Secondary | ICD-10-CM

## 2024-04-20 DIAGNOSIS — R519 Headache, unspecified: Secondary | ICD-10-CM

## 2024-04-20 DIAGNOSIS — G4733 Obstructive sleep apnea (adult) (pediatric): Secondary | ICD-10-CM | POA: Diagnosis not present

## 2024-04-20 NOTE — Progress Notes (Signed)
 Subjective:    Patient ID: Sherri Hunt is a 48 y.o. female.  HPI    Interim history:   Sherri Hunt is a 48 year old female with an underlying medical history of iron deficiency vitamin D deficiency, vitamin B12 deficiency, anemia, anxiety, depression, recurrent headaches and obesity, who presents for follow-up consultation of her recurrent headaches.  The patient is accompanied by her husband today.  I saw her in April 2025, at which time Sherri Hunt reported a history of recurrent headaches for several years.  Sherri Hunt was advised to reduce her caffeine intake and increase her water intake.  Sherri Hunt was advised to get a formal eye examination with an optometrist or ophthalmologist of her choosing.  Sherri Hunt was advised to proceed with a sleep study.  For intermittent migraine headaches as suggested as needed use of Ubrelvy .  Sherri Hunt had a home sleep test through our office on 01/26/2024 which showed mild obstructive sleep apnea with a total AHI of 8.8/hour but with an oxygen desaturation nadir of 72.2%.  Intermittent mild snoring was detected.  Sherri Hunt was offered home AutoPap therapy but declined.  Today, 04/20/2024: Sherri Hunt reports that her headaches do not seem to be as bad, has not started the Ubrelvy  yet.  Sherri Hunt has new insurance and would be willing to trial Ubrelvy  as needed.  Sherri Hunt is on Sonata at bedtime as needed for sleep.  Sherri Hunt has had increase in stress.  Sherri Hunt is trying to stay well-hydrated and limit her caffeine, still drinks about 16 ounces of coffee per day, sometimes 12, sometimes an additional soda serving per day.  Sherri Hunt drinks about 2-3 bottles of water per day. Sherri Hunt has not had an eye examination yet.  The patient's allergies, current medications, family history, past medical history, past social history, past surgical history and problem list were reviewed and updated as appropriate.   Previously:   01/05/2024: (Sherri Hunt) reports recurrent headaches for the past few years.  Sherri Hunt reports a longstanding history of migraines.   Sherri Hunt reports that her migraines occur about 2 or 3 times per month but Sherri Hunt has other headaches including morning headaches.  Sherri Hunt has daytime tiredness, has had associated blurry vision with her headaches.  Sherri Hunt has not had an eye examination in about 2 years.  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  Sherri Hunt quit smoking in 2015 or 2016.  Sherri Hunt has occasional trouble speaking when Sherri Hunt has a headache but sometimes her husband reports that her trouble speaking seems to be independent of headaches.  Sherri Hunt has not gone to the emergency room with any acute symptoms like these recently except for 2021.  Sherri Hunt has tried Maxalt recently but does not believe it helped, previously tried Imitrex which did not help.  Sherri Hunt does not recall if Ubrelvy  in the past had helped.  Sherri Hunt is currently on BuSpar as needed and Celexa low-dose.  Sherri Hunt drinks quite a bit of caffeine in the form of coffee with 3 or 4 shots of espresso in the morning, 1 soda per day, very little water, maybe 1 bottle per day.  Sherri Hunt goes to bed between 8 and 9 and rise time is between 6:30 AM and 7 AM.  Sherri Hunt takes occasional Tylenol or ibuprofen.  Her Epworth sleepiness score is 20 out of 24, fatigue severity score is 39 out of 63.  Sherri Hunt snores mildly per husband.  Sherri Hunt denies any sudden onset of one-sided weakness or droopy face or numbness.   I had evaluated her for recurrent headaches in 2021.  I had started her on a trial of Ubrelvy  and Sherri Hunt was on Imitrex at the time.  Sherri Hunt was advised to proceed with a sleep study, Sherri Hunt did not pursue testing at the time.  I reviewed your office note from 09/23/2023.  A brain MRI without contrast was ordered.  Sherri Hunt was started on Maxalt as needed.  Sherri Hunt reported headaches associated with facial tingling and vision changes.  Sherri Hunt reports that Sherri Hunt went for the MRI but could not completed.  Results are not available for my review today, I am not sure if there was a partial report on it.   Sherri Hunt had blood work through your office on 05/23/2024  including CBC with differential, anemia profile, vitamin D, vitamin B12, TSH.  Sherri Hunt is currently on BuSpar 10 mg twice daily as needed and citalopram 20 mg daily.   Sherri Hunt currently does not take any supplements after her bariatric surgery.  Sherri Hunt is followed by hematology for her iron deficiency and has received iron infusions from time to time.   12/29/2019: 49 year old right-handed woman with an underlying history of migraines, depression and morbid obesity with a BMI of over 50 and reports having had migraines since elementary school days.  Sherri Hunt has associated light sensitivity and nausea.  Sherri Hunt has tried Topamax for prevention in the past which was not helpful but since Sherri Hunt started Qsymia Sherri Hunt has found that her migraines are better.  Sherri Hunt used to have a migraine twice a week on average and now Sherri Hunt has 1-3 or maybe 4 times a month.  Sherri Hunt also believes that stress has been a trigger for her migraines.  Sherri Hunt has a stressful job.  Sherri Hunt tries to hydrate better with water.  In the past, when Sherri Hunt reduced her caffeine Sherri Hunt noticed a flareup in her migraines but when Sherri Hunt most recently stopped drinking sodas, Sherri Hunt did not notice a significant flareup in her migraines.  Sherri Hunt feels that her speech is better.  Sometimes Sherri Hunt has word finding difficulty.  Sherri Hunt also has some daytime tiredness.  Sherri Hunt reports that Sherri Hunt noticed an increase in her daytime tiredness since Sherri Hunt was diagnosed with Covid in January 2021.  Sherri Hunt lives with her husband, they have 5 children.  Sherri Hunt quit smoking in 2016 and currently limits her caffeine to 1 serving per day.  Sherri Hunt is trying to lose weight.  Sherri Hunt does snore.  Sherri Hunt has woken up with a headache.  Sherri Hunt does not have night to night nocturia.  Sherri Hunt is familiar with the sleep apnea diagnosis since her husband has a CPAP machine but Sherri Hunt herself has never had a sleep study.  Sherri Hunt would be willing to pursue a sleep study if treatment of sleep apnea may help her migraines.   In the past, for acute management Sherri Hunt has tried  Maxalt, Sherri Hunt is not sure if it helps, Sherri Hunt tried Zomig which did not help.  Sherri Hunt takes Imitrex as needed, has been on it for some years per primary care but it does make her feel jittery at times and also nauseated.   Sherri Hunt typically has an eye examination once a year, last exam was 6 or 8 months ago.  Sherri Hunt wears contacts.  Sherri Hunt has had intermittent blurry vision.  Denies double vision.   Sherri Hunt denies any sudden onset of one-sided weakness or numbness or tingling or droopy face.  Her husband felt that her left side of the mouth was droopy when Sherri Hunt was in the ER but had resolved while in the ER.  In the ER, Sherri Hunt had imaging tests, as well as teleneurology consult.  Sherri Hunt had a head CT without contrast on 11/24/2019 and I reviewed the results: IMPRESSION: 1. Normal head CT 2. ASPECTS is 10. Sherri Hunt had a CT angiogram of the head and neck with and without contrast on 11/24/2019 and I reviewed the results: IMPRESSION: 1. Normal CTA of the head and neck. No large vessel occlusion, hemodynamically significant stenosis, or other acute vascular abnormality. 2. Predominant fetal type origin of the PCAs with associated diminutive vertebrobasilar system. Sherri Hunt had a brain MRI without contrast on 11/25/2019 and I reviewed the results: IMPRESSION: Normal brain MRI for age. No acute intracranial infarct or other abnormality. Laboratory results included negative urinalysis, negative UDS and neg ethanol level. Symptomatic treatment in the emergency room included Decadron , Benadryl , Toradol , Reglan  and Ativan .   Her Past Medical History Is Significant For: Past Medical History:  Diagnosis Date   COVID-19 09/19/2019   Depression    Migraine     Her Past Surgical History Is Significant For: Past Surgical History:  Procedure Laterality Date   APPENDECTOMY     TONSILLECTOMY AND ADENOIDECTOMY      Her Family History Is Significant For: Family History  Problem Relation Age of Onset   Breast cancer Mother    Factor V  Leiden deficiency Mother    Thyroid disease Mother    Healthy Father     Her Social History Is Significant For: Social History   Socioeconomic History   Marital status: Married    Spouse name: Not on file   Number of children: 5   Years of education: 12   Highest education level: High school graduate  Occupational History   Occupation: accounting  Tobacco Use   Smoking status: Former    Current packs/day: 0.00    Types: Cigarettes    Quit date: 2016    Years since quitting: 9.6   Smokeless tobacco: Never  Substance and Sexual Activity   Alcohol use: Not Currently   Drug use: Never   Sexual activity: Not on file  Other Topics Concern   Not on file  Social History Narrative   Lives at home with family.   Right-handed.   1 cup of tea or coffee per day.   Social Drivers of Corporate investment banker Strain: Not on file  Food Insecurity: Not on file  Transportation Needs: Not on file  Physical Activity: Not on file  Stress: Not on file  Social Connections: Unknown (06/19/2023)   Received from [facility]   Social Network    Social Network: Not on file    Her Allergies Are:  Allergies  Allergen Reactions   Sulfa Antibiotics     itching  :   Her Current Medications Are:  Outpatient Encounter Medications as of 04/20/2024  Medication Sig   citalopram (CELEXA) 40 MG tablet Take 40 mg by mouth daily.   cyanocobalamin (,VITAMIN B-12,) 1000 MCG/ML injection Inject 1,000 mcg into the muscle once a week. (Patient not taking: Reported on 04/20/2024)   furosemide (LASIX) 20 MG tablet Take 20 mg by mouth daily. (Patient not taking: Reported on 04/20/2024)   Phentermine-Topiramate (QSYMIA) 7.5-46 MG CP24 Take 1 capsule by mouth daily. (Patient not taking: Reported on 04/20/2024)   SUMAtriptan (IMITREX) 50 MG tablet Take 50 mg by mouth as needed. (Patient not taking: Reported on 04/20/2024)   Ubrogepant  (UBRELVY ) 50 MG TABS Take 1 tablet (50 mg total) by mouth as needed (may repeat  once in 2 hours. no more than 2 pills in 24 h.). (Patient not taking: Reported on 04/20/2024)   Vitamin D, Ergocalciferol, (DRISDOL) 1.25 MG (50000 UNIT) CAPS capsule Take 50,000 Units by mouth once a week. (Patient not taking: Reported on 04/20/2024)   No facility-administered encounter medications on file as of 04/20/2024.  :  Review of Systems:  Out of a complete 14 point review of systems, all are reviewed and negative with the exception of these symptoms as listed below:  Review of Systems  Neurological:        Pt her migraines have not been as bad, Sherri Hunt has not tried the ubrelvy .      Objective:  Neurological Exam  Physical Exam Physical Examination:   Vitals:   04/20/24 1257  BP: 112/88  Pulse: (!) 53   General Examination: The patient is a very pleasant 48 y.o. female in no acute distress. Sherri Hunt appears well-developed and well-nourished and well groomed.   HEENT: Normocephalic, atraumatic, pupils are equal, round and reactive to light, extraocular tracking is good without limitation to gaze excursion or nystagmus noted, no photophobia.  Hearing is grossly intact. Face is symmetric with normal facial animation, speech is clear without dysarthria, hypophonia or voice tremor.  There is no lip, neck/head, jaw or voice tremor. Neck with FROM, no carotid bruits on auscultation. Oropharynx exam reveals: mild mouth dryness, adequate dental hygiene and moderate airway crowding, tongue protrudes centrally and palate elevates symmetrically.  Chest: Clear to auscultation without wheezing, rhonchi or crackles noted.   Heart: S1+S2+0, regular and normal without murmurs, rubs or gallops noted.  Mild bradycardia noted.   Abdomen: Soft, non-tender and non-distended.   Extremities: There is no obvious swelling in the distal lower extremities bilaterally.    Skin: Warm and dry without trophic changes noted.    Musculoskeletal: exam reveals no obvious joint deformities.    Neurologically:   Mental status: The patient is awake, alert and oriented in all 4 spheres. Her immediate and remote memory, attention, language skills and fund of knowledge are appropriate. There is no evidence of aphasia, agnosia, apraxia or anomia. Speech is clear with normal prosody and enunciation. Thought process is linear. Mood is normal and affect is normal.  Cranial nerves II - XII are as described above under HEENT exam.  Motor exam: Normal bulk, moving all 4 extremities without restriction, no obvious action or resting tremor.  No drift or rebound, no postural or intention tremor. Fine motor skills and coordination: intact finger taps, hand movements and rapid alternating patting with both upper extremities, normal foot taps bilaterally in the lower extremities.  Cerebellar testing: No dysmetria or intention tremor. There is no truncal or gait ataxia.   Sensory exam: intact to light touch. Gait, station and balance: Sherri Hunt stands easily. No veering to one side is noted. No leaning to one side is noted. Posture is age-appropriate and stance is narrow based. Gait shows normal stride length and normal pace. No problems turning are noted.  Normal tandem walk.   Assessment and Plan:  In summary, Sherri Hunt is a 48 year old female with an underlying medical history of iron deficiency vitamin D deficiency, vitamin B12 deficiency, anemia, anxiety, depression, recurrent headaches and obesity, who presents for follow-up consultation of her recurrent headaches.  Sherri Hunt feels that her headaches have improved, Sherri Hunt has not tried the Ubrelvy  yet but may be able to fill it at this point since Sherri Hunt has new insurance.   Neurologic exam is nonfocal.  Sherri Hunt was advised regarding mild sleep apnea based on her test results but Sherri Hunt declines treatment with AutoPap therapy.    Below is a summary of my discussion points from today's visit and my recommendations for the patient.  Sherri Hunt was given these instructions verbally and also in her  MyChart after visit summary.     << Please remember, common headache triggers are: sleep deprivation, dehydration, overheating, stress, hypoglycemia or skipping meals and blood sugar fluctuations, excessive pain medications or excessive alcohol use or caffeine withdrawal. Some people have food triggers such as aged cheese, orange juice or chocolate, especially dark chocolate, or MSG (monosodium glutamate). Try to avoid these headache triggers as much possible. It may be helpful to keep a headache diary to figure out what makes your headaches worse or brings them on and what alleviates them. Some people report headache onset after exercise but studies have shown that regular exercise may actually prevent headaches from coming. If you have exercise-induced headaches, please make sure that you drink plenty of fluid before and after exercising and that you do not over do it and do not overheat. Please increase your water intake to about 4 bottles/day, 16.9 oz size each.  Reduce your caffeine intake to up to about 1 serving/day, as caffeine can drive headaches.  Please schedule a formal eye exam with any optometrist of your choosing. Strain on the eyes can cause headaches.  Should you change your mind regarding AutoPap therapy for your mild sleep apnea, I would be happy to pursue it with you and order an AutoPap machine for you.   Go ahead and fill the Ubrelvy  for as needed use for migraine headaches, you can talk to your pharmacist about filling it at this point.  I am not sure if we need to restart the process of prior authorization however. We will plan a follow up in about 6 to 8 months with the nurse practitioner.  >>   I answered all their questions today and the patient and her husband were in agreement.    I spent 30 minutes in total face-to-face time and in reviewing records during pre-charting, more than 50% of which was spent in counseling and coordination of care, reviewing test results,  reviewing medications and treatment regimen and/or in discussing or reviewing the diagnosis of recurrent headaches, the prognosis and treatment options. Pertinent laboratory and imaging test results that were available during this visit with the patient were reviewed by me and considered in my medical decision making (see chart for details).

## 2024-04-20 NOTE — Patient Instructions (Signed)
 Please remember, common headache triggers are: sleep deprivation, dehydration, overheating, stress, hypoglycemia or skipping meals and blood sugar fluctuations, excessive pain medications or excessive alcohol use or caffeine withdrawal. Some people have food triggers such as aged cheese, orange juice or chocolate, especially dark chocolate, or MSG (monosodium glutamate). Try to avoid these headache triggers as much possible. It may be helpful to keep a headache diary to figure out what makes your headaches worse or brings them on and what alleviates them. Some people report headache onset after exercise but studies have shown that regular exercise may actually prevent headaches from coming. If you have exercise-induced headaches, please make sure that you drink plenty of fluid before and after exercising and that you do not over do it and do not overheat. Please increase your water intake to about 4 bottles/day, 16.9 oz size each.  Reduce your caffeine intake to up to about 1 serving/day, as caffeine can drive headaches.  Please schedule a formal eye exam with any optometrist of your choosing. Strain on the eyes can cause headaches.  Should you change your mind regarding AutoPap therapy for your mild sleep apnea, I would be happy to pursue it with you and order an AutoPap machine for you.   Go ahead and fill the Ubrelvy  for as needed use for migraine headaches, you can talk to your pharmacist about filling it at this point.  I am not sure if we need to restart the process of prior authorization however. We will plan a follow up in about 6 to 8 months with the nurse practitioner.

## 2024-04-20 NOTE — Addendum Note (Signed)
 Addended by: NEYSA NENA RAMAN on: 04/20/2024 02:02 PM   Modules accepted: Orders

## 2024-04-20 NOTE — Telephone Encounter (Signed)
 PT has new insurance, I also see that PT has appointment today-will follow up to see if PT is staying on Ubrelvy . I do not see where it has been filled per the CCM med history in Epic.

## 2024-04-23 ENCOUNTER — Other Ambulatory Visit (HOSPITAL_COMMUNITY): Payer: Self-pay

## 2024-04-23 NOTE — Telephone Encounter (Signed)
 Pharmacy Patient Advocate Encounter   Received notification from CoverMyMeds that prior authorization for Ubrelvy  50MG  tablets is required/requested.   Insurance verification completed.   The patient is insured through [facility] .   Per test claim: PA required; PA submitted to above mentioned insurance via CoverMyMeds Key/confirmation #/EOC [facility] [place] Status is pending

## 2024-04-23 NOTE — Telephone Encounter (Signed)
 Pharmacy Patient Advocate Encounter  Received notification from [employer] that Prior Authorization for Ubrelvy  50MG  tablets has been APPROVED from 04/09/2024 to 04/23/2025   PA #/Case ID/Reference #: PA Case ID #: [id number]

## 2024-05-11 ENCOUNTER — Ambulatory Visit: Admitting: Neurology

## 2024-11-09 ENCOUNTER — Ambulatory Visit: Admitting: Adult Health
